# Patient Record
Sex: Male | Born: 1990 | Race: Black or African American | Marital: Single | Smoking: Never smoker
Health system: Southern US, Community
[De-identification: ages and names within clinical notes are randomized; demographics above are authoritative.]

## PROBLEM LIST (undated history)

## (undated) DIAGNOSIS — F329 Major depressive disorder, single episode, unspecified: Secondary | ICD-10-CM

## (undated) DIAGNOSIS — F32A Depression, unspecified: Secondary | ICD-10-CM

## (undated) DIAGNOSIS — J40 Bronchitis, not specified as acute or chronic: Secondary | ICD-10-CM

## (undated) DIAGNOSIS — F419 Anxiety disorder, unspecified: Secondary | ICD-10-CM

---

## 2012-09-03 ENCOUNTER — Emergency Department (HOSPITAL_COMMUNITY)
Admission: EM | Admit: 2012-09-03 | Discharge: 2012-09-03 | Disposition: A | Discharge status: Home / Self Care | Payer: BC Managed Care – PPO | Attending: Emergency Medicine | Admitting: Emergency Medicine

## 2012-09-03 ENCOUNTER — Encounter (HOSPITAL_COMMUNITY): Payer: Self-pay | Admitting: *Deleted

## 2012-09-03 DIAGNOSIS — R51 Headache: Secondary | ICD-10-CM | POA: Insufficient documentation

## 2012-09-03 DIAGNOSIS — R509 Fever, unspecified: Secondary | ICD-10-CM

## 2012-09-03 DIAGNOSIS — IMO0001 Reserved for inherently not codable concepts without codable children: Secondary | ICD-10-CM | POA: Insufficient documentation

## 2012-09-03 DIAGNOSIS — K5900 Constipation, unspecified: Secondary | ICD-10-CM | POA: Insufficient documentation

## 2012-09-03 DIAGNOSIS — Z8719 Personal history of other diseases of the digestive system: Secondary | ICD-10-CM

## 2012-09-03 DIAGNOSIS — R52 Pain, unspecified: Secondary | ICD-10-CM | POA: Insufficient documentation

## 2012-09-03 DIAGNOSIS — R110 Nausea: Secondary | ICD-10-CM

## 2012-09-03 DIAGNOSIS — R630 Anorexia: Secondary | ICD-10-CM | POA: Insufficient documentation

## 2012-09-03 DIAGNOSIS — R0609 Other forms of dyspnea: Secondary | ICD-10-CM | POA: Insufficient documentation

## 2012-09-03 DIAGNOSIS — B338 Other specified viral diseases: Secondary | ICD-10-CM | POA: Insufficient documentation

## 2012-09-03 DIAGNOSIS — R0989 Other specified symptoms and signs involving the circulatory and respiratory systems: Secondary | ICD-10-CM | POA: Insufficient documentation

## 2012-09-03 LAB — URINALYSIS, ROUTINE W REFLEX MICROSCOPIC
Glucose, UA: NEGATIVE mg/dL
Ketones, ur: NEGATIVE mg/dL
Leukocytes, UA: NEGATIVE
Nitrite: NEGATIVE
Specific Gravity, Urine: 1.011 (ref 1.005–1.030)
pH: 7.5 (ref 5.0–8.0)

## 2012-09-03 MED ORDER — ONDANSETRON 4 MG PO TBDP
4.0000 mg | ORAL_TABLET | Freq: Once | ORAL | Status: AC
  Administered 2012-09-03: 4 mg via ORAL
  Filled 2012-09-03: qty 1

## 2012-09-03 MED ORDER — ONDANSETRON 4 MG PO TBDP: 4.0000 mg | ORAL_TABLET | Freq: Once | ORAL | Status: DC

## 2012-09-03 NOTE — Discharge Instructions (Signed)
Nausea and Vomiting Nausea is a sick feeling that often comes before throwing up (vomiting). Vomiting is a reflex where stomach contents come out of your mouth. Vomiting can cause severe loss of body fluids (dehydration). Children and elderly adults can become dehydrated quickly, especially if they also have diarrhea. Nausea and vomiting are symptoms of a condition or disease. It is important to find the cause of your symptoms. CAUSES   Direct irritation of the stomach lining. This irritation can result from increased acid production (gastroesophageal reflux disease), infection, food poisoning, taking certain medicines (such as nonsteroidal anti-inflammatory drugs), alcohol use, or tobacco use.  Signals from the brain.These signals could be caused by a headache, heat exposure, an inner ear disturbance, increased pressure in the brain from injury, infection, a tumor, or a concussion, pain, emotional stimulus, or metabolic problems.  An obstruction in the gastrointestinal tract (bowel obstruction).  Illnesses such as diabetes, hepatitis, gallbladder problems, appendicitis, kidney problems, cancer, sepsis, atypical symptoms of a heart attack, or eating disorders.  Medical treatments such as chemotherapy and radiation.  Receiving medicine that makes you sleep (general anesthetic) during surgery. DIAGNOSIS Your caregiver may ask for tests to be done if the problems do not improve after a few days. Tests may also be done if symptoms are severe or if the reason for the nausea and vomiting is not clear. Tests may include:  Urine tests.  Blood tests.  Stool tests.  Cultures (to look for evidence of infection).  X-rays or other imaging studies. Test results can help your caregiver make decisions about treatment or the need for additional tests. TREATMENT You need to stay well hydrated. Drink frequently but in small amounts.You may wish to drink water, sports drinks, clear broth, or eat frozen  ice pops or gelatin dessert to help stay hydrated.When you eat, eating slowly may help prevent nausea.There are also some antinausea medicines that may help prevent nausea. HOME CARE INSTRUCTIONS   Take all medicine as directed by your caregiver.  If you do not have an appetite, do not force yourself to eat. However, you must continue to drink fluids.  If you have an appetite, eat a normal diet unless your caregiver tells you differently.  Eat a variety of complex carbohydrates (rice, wheat, potatoes, bread), lean meats, yogurt, fruits, and vegetables.  Avoid high-fat foods because they are more difficult to digest.  Drink enough water and fluids to keep your urine clear or pale yellow.  If you are dehydrated, ask your caregiver for specific rehydration instructions. Signs of dehydration may include:  Severe thirst.  Dry lips and mouth.  Dizziness.  Dark urine.  Decreasing urine frequency and amount.  Confusion.  Rapid breathing or pulse. SEEK IMMEDIATE MEDICAL CARE IF:   You have blood or brown flecks (like coffee grounds) in your vomit.  You have black or bloody stools.  You have a severe headache or stiff neck.  You are confused.  You have severe abdominal pain.  You have chest pain or trouble breathing.  You do not urinate at least once every 8 hours.  You develop cold or clammy skin.  You continue to vomit for longer than 24 to 48 hours.  You have a fever. MAKE SURE YOU:   Understand these instructions.  Will watch your condition.  Will get help right away if you are not doing well or get worse. Document Released: 09/19/2005 Document Revised: 12/12/2011 Document Reviewed: 02/16/2011 [facility] Patient Information 2013 [place], [place]. Fever, Adult A fever is  a temperature of 100.4 F (38 C) or above.  HOME CARE  Take fever medicine as told by your doctor. Do not  take aspirin for fever if you are younger than 22 years of age.  If you are given  antibiotic medicine, take it as told. Finish the medicine even if you start to feel better.  Rest.  Drink enough fluids to keep your pee (urine) clear or pale yellow. Do not drink alcohol.  Take a bath or shower with room temperature water. Do not use ice water or alcohol sponge baths.  Wear lightweight, loose clothes. GET HELP RIGHT AWAY IF:   You are short of breath or have trouble breathing.  You are very weak.  You are dizzy or you pass out (faint).  You are very thirsty or are making little or no urine.  You have new pain.  You throw up (vomit) or have watery poop (diarrhea).  You keep throwing up or having watery poop for more than 1 to 2 days.  You have a stiff neck or light bothers your eyes.  You have a skin rash.  You have a fever or problems (symptoms) that last for more than 2 to 3 days.  You have a fever and your problems quickly get worse.  You keep throwing up the fluids you drink.  You do not feel better after 3 days.  You have new problems. MAKE SURE YOU:   Understand these instructions.  Will watch your condition.  Will get help right away if you are not doing well or get worse. Document Released: 06/28/2008 Document Revised: 12/12/2011 Document Reviewed: 07/21/2011 [facility] Patient Information 2013 [place], [place].

## 2012-09-03 NOTE — ED Notes (Signed)
Pts symptoms started this morning and have been intermittent since then; Pt denies dizziness and lightheadedness; Pt states when headache is bad it is really and and almost decapitating; pt denies shortness of breath; pt states nausea and has not eaten; pt mentating appropriately; pt states weakness; pt denies chills currently.

## 2012-09-03 NOTE — ED Notes (Signed)
Dr. Bonk at bedside 

## 2012-09-03 NOTE — ED Provider Notes (Signed)
History   This chart was scribed for Dakota Skene, MD by Gerlean Ren, ED Scribe. This patient was seen in room TR08C/TR08C and the patient's care was started at 7:36 PM    CSN: [id number]  Arrival date & time 09/03/12  1755   First MD Initiated Contact with Patient 09/03/12 1904      Chief Complaint  Patient presents with  . Fever  . Generalized Body Aches     The history is provided by the patient. No language interpreter was used.   Dakota Mcgee is a 21 y.o. male with h/o chronic constipation who presents to the Emergency Department complaining of waxing-and-waning fevers, chills, myalgias and HA all with gradual onset this morning.  Symptoms not improved by ibuprofen taken this morning and temporarily improved by Aleve 3 hours ago but are currently gradually returning.  Pt reports associated nausea with one episode of dry heaves, decreased appetite, and dyspnea while walking to class today.  Pt denies irregular bowel movements, cough, sick contacts, frequency, abdominal pain, chest pain, and sore throat as associated symptoms.  Pt states he was given an unknown medication for his chronic constipation but did not take it regularly.  Pt denies tobacco and alcohol use.  Pt expresses a clear concern that he may have an STD due to having unprotected sex last week and reports occasional dysuria, however pt denies any penile discharge, penile bleeding, or any further symptoms.  History reviewed. No pertinent past medical history.  History reviewed. No pertinent past surgical history.  History reviewed. No pertinent family history.  History  Substance Use Topics  . Smoking status: Not on file  . Smokeless tobacco: Not on file  . Alcohol Use: Not on file      Review of Systems At least 10pt or greater review of systems completed and are negative except where specified in the HPI.  Allergies  Review of patient's allergies indicates no known allergies.  Home Medications  No  current outpatient prescriptions on file.  BP 116/89  Pulse 99  Temp 100.8 F (38.2 C) (Oral)  Resp 16  SpO2 98%  Physical Exam  Nursing notes reviewed.  Electronic medical record reviewed. VITAL SIGNS:   Filed Vitals:   09/03/12 1807 09/03/12 2048  BP: 116/89 113/55  Pulse: 99 89  Temp: 100.8 F (38.2 C) 99.3 F (37.4 C)  TempSrc: Oral Oral  Resp: 16 17  SpO2: 98% 100%   CONSTITUTIONAL: Awake, oriented, appears non-toxic HENT: Atraumatic, normocephalic, oral mucosa pink and moist, airway patent. Nares patent without drainage. External ears normal. EYES: Conjunctiva clear, EOMI, PERRLA NECK: Trachea midline, non-tender, supple CARDIOVASCULAR: Normal heart rate, Normal rhythm, No murmurs, rubs, gallops PULMONARY/CHEST: Clear to auscultation, no rhonchi, wheezes, or rales. Symmetrical breath sounds. Non-tender. ABDOMINAL: Non-distended, soft, non-tender - no rebound or guarding.  BS normal. GU: Normal circumcised male, no masses, no hernia, no lesions NEUROLOGIC: Non-focal, moving all four extremities, no gross sensory or motor deficits. EXTREMITIES: No clubbing, cyanosis, or edema SKIN: Warm, Dry, No erythema, No rash  ED Course  Procedures (including critical care time) DIAGNOSTIC STUDIES: Oxygen Saturation is 98% on room air, normal by my interpretation.    COORDINATION OF CARE: 7:48 PM- Patient informed of clinical course, understands medical decision-making process, and agrees with plan.  Labs Reviewed - No data to display Results for orders placed during the hospital encounter of 09/03/12  URINALYSIS, ROUTINE W REFLEX MICROSCOPIC      Component Value Range   Color, Urine  YELLOW  YELLOW   APPearance CLEAR  CLEAR   Specific Gravity, Urine 1.011  1.005 - 1.030   pH 7.5  5.0 - 8.0   Glucose, UA NEGATIVE  NEGATIVE mg/dL   Hgb urine dipstick NEGATIVE  NEGATIVE   Bilirubin Urine NEGATIVE  NEGATIVE   Ketones, ur NEGATIVE  NEGATIVE mg/dL   Protein, ur NEGATIVE   NEGATIVE mg/dL   Urobilinogen, UA 1.0  0.0 - 1.0 mg/dL   Nitrite NEGATIVE  NEGATIVE   Leukocytes, UA NEGATIVE  NEGATIVE    No results found.   1. Fever   2. Nausea   3. H/O constipation       MDM  Dakota Mcgee is a 21 y.o. male presenting with a history of nausea, history of constipation and fever. She is nontoxic, is normal vital signs, and a mild fever of 100.8. Patient has some mild GI distress with body aches chills decreased appetite, headache no real upper respiratory syndrome, but still this is a viral type illness. Patient says he had unprotected sex-no indication of any lesions or STI at this time. Discussed testing for sexually transmitted infections including HIV in the future with his primary care physician.  Patient had unprotected sex last week-I doubt that just last week he's contracted HIV and has an acute reaction. told to stay on a bowel regimen given Zofran for nausea  I explained the diagnosis and have given explicit precautions to return to the ER including any other new or worsening symptoms. The patient understands and accepts the medical plan as it's been dictated and I have answered their questions. Discharge instructions concerning home care and prescriptions have been given.  The patient is STABLE and is discharged to home in good condition.   I personally performed the services described in this documentation, which was scribed in my presence. The recorded information has been reviewed and is accurate. Dakota Mcgee, M.D.          Dakota Skene, MD 09/09/12 2138

## 2012-09-03 NOTE — ED Notes (Addendum)
Pt in c/o generalized body aches, chills, decreased appetite, also headache, symptoms since this am

## 2012-09-03 NOTE — ED Notes (Signed)
Pt ambulatory leaving ED with friend. Pt given d/c teaching and prescriptions. Pt has no further questions upon d/c. Pt does not appear to be in any acute distress.

## 2015-07-30 ENCOUNTER — Emergency Department (HOSPITAL_COMMUNITY)
Admission: EM | Admit: 2015-07-30 | Discharge: 2015-07-30 | Disposition: A | Discharge status: Home / Self Care | Payer: Self-pay | Attending: Emergency Medicine | Admitting: Emergency Medicine

## 2015-07-30 ENCOUNTER — Emergency Department (HOSPITAL_COMMUNITY): Payer: Self-pay

## 2015-07-30 ENCOUNTER — Encounter (HOSPITAL_COMMUNITY): Payer: Self-pay | Admitting: *Deleted

## 2015-07-30 DIAGNOSIS — R0789 Other chest pain: Secondary | ICD-10-CM | POA: Insufficient documentation

## 2015-07-30 DIAGNOSIS — R0602 Shortness of breath: Secondary | ICD-10-CM | POA: Insufficient documentation

## 2015-07-30 DIAGNOSIS — J209 Acute bronchitis, unspecified: Secondary | ICD-10-CM | POA: Insufficient documentation

## 2015-07-30 DIAGNOSIS — R0981 Nasal congestion: Secondary | ICD-10-CM | POA: Insufficient documentation

## 2015-07-30 HISTORY — DX: Bronchitis, not specified as acute or chronic: J40

## 2015-07-30 MED ORDER — PREDNISONE 20 MG PO TABS
60.0000 mg | ORAL_TABLET | Freq: Once | ORAL | Status: AC
  Administered 2015-07-30: 60 mg via ORAL
  Filled 2015-07-30: qty 3

## 2015-07-30 MED ORDER — ALBUTEROL SULFATE (2.5 MG/3ML) 0.083% IN NEBU
5.0000 mg | INHALATION_SOLUTION | Freq: Once | RESPIRATORY_TRACT | Status: AC
  Administered 2015-07-30: 5 mg via RESPIRATORY_TRACT

## 2015-07-30 MED ORDER — AEROCHAMBER PLUS W/MASK MISC
1.0000 | Freq: Once | Status: AC
  Administered 2015-07-30: 1
  Filled 2015-07-30: qty 1

## 2015-07-30 MED ORDER — ALBUTEROL SULFATE (2.5 MG/3ML) 0.083% IN NEBU
5.0000 mg | INHALATION_SOLUTION | Freq: Once | RESPIRATORY_TRACT | Status: AC
Start: 2015-07-30 — End: 2015-07-30
  Administered 2015-07-30: 5 mg via RESPIRATORY_TRACT
  Filled 2015-07-30: qty 6

## 2015-07-30 MED ORDER — ALBUTEROL SULFATE HFA 108 (90 BASE) MCG/ACT IN AERS
2.0000 | INHALATION_SPRAY | RESPIRATORY_TRACT | Status: DC | PRN
  Administered 2015-07-30: 2 via RESPIRATORY_TRACT
  Filled 2015-07-30: qty 6.7

## 2015-07-30 MED ORDER — PREDNISONE 20 MG PO TABS: ORAL_TABLET | ORAL | Status: DC

## 2015-07-30 MED ORDER — ALBUTEROL SULFATE (2.5 MG/3ML) 0.083% IN NEBU
INHALATION_SOLUTION | RESPIRATORY_TRACT | Status: AC
  Filled 2015-07-30: qty 6

## 2015-07-30 NOTE — ED Provider Notes (Signed)
CSN: [id number]     Arrival date & time 07/30/15  1219 History   First MD Initiated Contact with Patient 07/30/15 1438     Chief Complaint  Patient presents with  . Shortness of Breath  . Wheezing     (Consider location/radiation/quality/duration/timing/severity/associated sxs/prior Treatment) HPI Complains of nasal congestion cough productive of yellowish sputum wheezing and shortness of breath and chest pain anterior worse with coughing onset 1 week ago. No known fever. He is treated himself with Tylenol Sinus medicine without relief. 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 He feels that his breathing is at baseline since treated with albuterol nebulizer here prior to my exam Past Medical History  Diagnosis Date  . Bronchitis    History reviewed. No pertinent past surgical history. No family history on file. Social History  Substance Use Topics  . Smoking status: Never Smoker   . Smokeless tobacco: None  . Alcohol Use: No    no illicit drug use Review of Systems  Constitutional: Negative.   HENT: Positive for congestion.   Respiratory: Positive for cough, shortness of breath and wheezing.   Cardiovascular: Negative.   Gastrointestinal: Negative.   Musculoskeletal: Negative.   Skin: Negative.   Neurological: Negative.   Psychiatric/Behavioral: Negative.   All other systems reviewed and are negative.     Allergies  Lactose intolerance (gi)  Home Medications   Prior to Admission medications   Medication Sig Start Date End Date Taking? Authorizing Provider  ibuprofen (ADVIL,MOTRIN) 200 MG tablet Take 200 mg by mouth every 6 (six) hours as needed. For pain   Yes Historical Provider, MD  naproxen sodium (ANAPROX) 220 MG tablet Take 220 mg by mouth 2 (two) times daily as needed. For pain   Yes Historical Provider, MD  ondansetron (ZOFRAN-ODT) 4 MG disintegrating tablet Take 1 tablet (4 mg total) by mouth  once. 09/03/12  Yes John-Adam Bonk, MD  pseudoephedrine-acetaminophen (TYLENOL SINUS) 30-500 MG TABS tablet Take 1 tablet by mouth every 4 (four) hours as needed (congestion).   Yes Historical Provider, MD   BP 127/84 mmHg  Pulse 93  Temp(Src) 98.6 F (37 C) (Oral)  Resp 20  SpO2 97% Physical Exam  Constitutional: He appears well-developed and well-nourished. No distress.  HENT:  Head: Normocephalic and atraumatic.  Eyes: Conjunctivae are normal. Pupils are equal, round, and reactive to light.  Neck: Neck supple. No tracheal deviation present. No thyromegaly present.  Cardiovascular: Normal rate and regular rhythm.   No murmur heard. Pulmonary/Chest: Effort normal and breath sounds normal.  Abdominal: Soft. Bowel sounds are normal. He exhibits no distension. There is no tenderness.  Musculoskeletal: Normal range of motion. He exhibits no edema or tenderness.  Neurological: He is alert. Coordination normal.  Skin: Skin is warm and dry. No rash noted.  Psychiatric: He has a normal mood and affect.  Nursing note and vitals reviewed.   ED Course  Procedures (including critical care time) Labs Review Labs Reviewed - No data to display  Imaging Review Dg Chest 2 View  07/30/2015  CLINICAL DATA:  Shortness of breath. EXAM: CHEST  2 VIEW COMPARISON:  None. FINDINGS: Bronchitic markings without collapse or consolidation. No edema, effusion, or air leak. Normal heart size and mediastinal contours. IMPRESSION: Bronchitis without pneumonia. Electronically Signed   By: Marnee SpringJonathon  Watts M.D.   On: 07/30/2015 13:04   I have personally reviewed and evaluated these images and lab results as part of  my medical decision-making.   EKG Interpretation   Date/Time:  Thursday July 30 2015 12:29:41 EDT Ventricular Rate:  81 PR Interval:  158 QRS Duration: 80 QT Interval:  348 QTC Calculation: 404 R Axis:   73 Text Interpretation:  Normal sinus rhythm with sinus arrhythmia Normal ECG  No old  tracing to compare Confirmed by Lolly Glaus  MD, Tosca Pletz [phone]) on  07/30/2015 3:08:32 PM     Chest x-ray viewed by me  1540 p.m. patient and at around the emergency department and his pulse oximetry desaturated to 83%. He became mildly short of breath. A second nebulized treatment was ordered and he was administered prednisone orally. At 1700 hrs. patient is breathing at baseline. He is able to ambulate with pulse oximetry not going below 97% and feels ready to go home Results for orders placed or performed during the hospital encounter of 09/03/12  Urinalysis, Routine w reflex microscopic  Result Value Ref Range   Color, Urine YELLOW YELLOW   APPearance CLEAR CLEAR   Specific Gravity, Urine 1.011 1.005 - 1.030   pH 7.5 5.0 - 8.0   Glucose, UA NEGATIVE NEGATIVE mg/dL   Hgb urine dipstick NEGATIVE NEGATIVE   Bilirubin Urine NEGATIVE NEGATIVE   Ketones, ur NEGATIVE NEGATIVE mg/dL   Protein, ur NEGATIVE NEGATIVE mg/dL   Urobilinogen, UA 1.0 0.0 - 1.0 mg/dL   Nitrite NEGATIVE NEGATIVE   Leukocytes, UA NEGATIVE NEGATIVE   Dg Chest 2 View  07/30/2015  CLINICAL DATA:  Shortness of breath. EXAM: CHEST  2 VIEW COMPARISON:  None. FINDINGS: Bronchitic markings without collapse or consolidation. No edema, effusion, or air leak. Normal heart size and mediastinal contours. IMPRESSION: Bronchitis without pneumonia. Electronically Signed   By: Marnee Spring M.D.   On: 07/30/2015 13:04    MDM  Prescription prednisone, albuterol HFA to go with spacer to use 2 puffs every 4 hours when necessary cough or shortness of breath. He is instructed to return if there are more than 4 hours. He'll be referred to [facility] and also be given the resource guide to get a primary care physician Diagnosis acute bronchitis Final diagnoses:  None        Doug Sou, MD 07/30/15 1721

## 2015-07-30 NOTE — Discharge Instructions (Signed)
Acute Bronchitis Usually inhaler 2 puffs every 4 hours as needed for cough or shortness of breath. Return if needed more than every 4 hours. Call any of the numbers  from the resource guide or the [facility] to get a primary care physician Bronchitis is when the airways that extend from the windpipe into the lungs get red, puffy, and painful (inflamed). Bronchitis often causes thick spit (mucus) to develop. This leads to a cough. A cough is the most common symptom of bronchitis. In acute bronchitis, the condition usually begins suddenly and goes away over time (usually in 2 weeks). Smoking, allergies, and asthma can make bronchitis worse. Repeated episodes of bronchitis may cause more lung problems. HOME CARE  Rest.  Drink enough fluids to keep your pee (urine) clear or pale yellow (unless you need to limit fluids as told by your doctor).  Only take over-the-counter or prescription medicines as told by your doctor.  Avoid smoking and secondhand smoke. These can make bronchitis worse. If you are a smoker, think about using nicotine gum or skin patches. Quitting smoking will help your lungs heal faster.  Reduce the chance of getting bronchitis again by:  Washing your hands often.  Avoiding people with cold symptoms.  Trying not to touch your hands to your mouth, nose, or eyes.  Follow up with your doctor as told. GET HELP IF: Your symptoms do not improve after 1 week of treatment. Symptoms include:  Cough.  Fever.  Coughing up thick spit.  Body aches.  Chest congestion.  Chills.  Shortness of breath.  Sore throat. GET HELP RIGHT AWAY IF:   You have an increased fever.  You have chills.  You have severe shortness of breath.  You have bloody thick spit (sputum).  You throw up (vomit) often.  You lose too much body fluid (dehydration).  You have a severe headache.  You faint. MAKE SURE YOU:   Understand these instructions.  Will  watch your condition.  Will get help right away if you are not doing well or get worse.   This information is not intended to replace advice given to you by your health care provider. Make sure you discuss any questions you have with your health care provider.   Document Released: 03/07/2008 Document Revised: 05/22/2013 Document Reviewed: 03/12/2013 Elsevier Interactive Patient Education 2016 [employer].  Emergency Department Resource Guide 1) Find a Doctor and Pay Out of Pocket Although you won't have to find out who is covered by your insurance plan, it is a good idea to ask around and get recommendations. You will then need to call the office and see if the doctor you have chosen will accept you as a new patient and what types of options they offer for patients who are self-pay. Some doctors offer discounts or will set up payment plans for their patients who do not have insurance, but you will need to ask so you aren't surprised when you get to your appointment.  2) Contact Your Local Health Department Not all health departments have doctors that can see patients for sick visits, but many do, so it is worth a call to see if yours does. If you don't know where your local health department is, you can check in your phone book. The CDC also has a tool to help you locate your state's health department, and many state websites also have listings of all of their local health departments.  3) Find a [employer]  If your illness is not likely to be very severe or complicated, you may want to try a walk in clinic. These are popping up all over the country in pharmacies, drugstores, and shopping centers. They're usually staffed by nurse practitioners or physician assistants that have been trained to treat common illnesses and complaints. They're usually fairly quick and inexpensive. However, if you have serious medical issues or chronic medical problems, these are probably not your best option.  No  Primary Care Doctor: - Call Health Connect at  [phone] - they can help you locate a primary care doctor that  accepts your insurance, provides certain services, etc. - Physician Referral Service- [phone]  Chronic Pain Problems: Organization         Address  Phone   Notes  [facility]  [phone] Patients need to be referred by their primary care doctor.   Medication Assistance: Organization         Address  Phone   Notes  [facility] [address] [place], [place] [address] [phone] --Must be a resident of [facility] [place] -- Must have NO insurance coverage whatsoever (no Medicaid/ Medicare, etc.) -- The pt. MUST have a primary care doctor that directs their care regularly and follows them in the community   MedAssist  [phone]   [employer]  [phone]    Agencies that provide inexpensive medical care: Organization         Address  Phone   Notes  [facility]  [phone]   [facility]    [phone]   [facility] [address] [place], [place] [address] [phone]   [facility] [address], [place] [phone]   [facility]anned [facility]    [phone](336) [phone]   [facility]    [phone]   [facility]  [address], [place] Phone:  [phone]) [phone], Fax:  [phone]) [phone] Hours of Operation:  9 am - 6 pm, M-F.  Also accepts Medicaid/Medicare and self-pay.  [facility]  [address], [place] Phone: [phone](336) [phone], Fax: [phone]) [phone]. Hours of Operation:  8:30 am - 5:30 pm, M-F.  Also accepts Medicaid and self-pay.  [facility] [place] [address], [place] Phone: [phone](336) [phone]   [facility] [address], [place], [place] [phone], Ext. [phone] Mondays & Thursdays: 7-9 AM.  First 15 patients are seen on  a first come, first serve basis.    Medicaid-accepting [facility] Providers:  Organization         Address  Phone   Notes  [facility] [address], [place] [phone] Also accepts self-pay patients.  [facility] [address], [place]  [phone](336) [phone]   [facility] [address], [place] [phone]   [facility] [address] [place] [address], [place] [phone]   Renaye RakersVeita Bland [address], [place]   [phone] Only accepts [place] Access [place] patients after they have their name applied to their card.   Self-Pay (no insurance) in [facility] [place]:  Organization         Address  Phone   Notes  Sickle Cell Patients, [facility] [address], [place] [phone]   °[facility] [address], [place] [phone]   °[facility] [place] ° [address], [place] [phone]   °[facility]/Dr. Osei-Bonsu ° [address] [place] [address], [place] or [address], [place] [phone] Phone number for both [place] and [place] locations is the same.  °[facility] [address], [place] [phone]   °[facility] [address] [place] [address], [place] or [address] [phone] °([phone]   °[facility] [address], [place] [phone], phone; [phone], fax Sees patients 1st and 3rd Saturday of every month.  Must not qualify for public or private insurance (i.e. Medicaid, Medicare, [place] Health Choice, Veterans' Benefits) • Household income should be no more than 200% of the poverty level •The clinic cannot treat you if you are pregnant or think you are pregnant • Sexually transmitted diseases are not treated at the clinic.  ° ° °Dental Care: °Organization          Address  Phone  Notes  °[facility] [address], [place] [phone] Accepts children up to age 21 who are enrolled in Medicaid or [place] Health Choice; pregnant women with a Medicaid card; and children who have applied for Medicaid or [place] Health Choice, but were declined, whose parents can pay a reduced fee at time of service.  °[facility] [place]  [address], [place] [phone] Accepts children up to age 21 who are enrolled in Medicaid or [place] Health Choice; pregnant women with a Medicaid card; and children who have applied for Medicaid or [place] Health Choice, but were declined, whose parents can pay a reduced fee at time of service.  °[facility] ° [address], [place] [phone] Patients are seen by appointment only. Walk-ins are not accepted. [facility] will see patients 18 years of age and older. °Monday - Tuesday (8am-5pm) °Most Wednesdays (8:30-5pm) °$30 per visit, cash only  °[facility] ° [address], [place] [phone]) [phone] Patients are seen by appointment only. Walk-ins are not accepted. [facility] will see patients 18 years of age and older. °One Wednesday Evening (Monthly: Volunteer Based).  $30 per visit, cash only  °[facility]  [phone] for adults; Children under age 4, call Graduate Pediatric Dentistry at [phone]. Children aged 4-14, please call [phone] to request a pediatric application. ° Dental services are provided in all areas of dental care including fillings, crowns and bridges, complete and partial dentures, implants, gum treatment, root canals, and extractions. Preventive care is also provided. Treatment is provided to both adults and children. °Patients are selected via a lottery and there is often a waiting list. °  °[facility] [address], °[place] ° ([phone]) [phone] [email or web address] °  °[facility] [address], [place], [place] [phone], Ext. [phone] Second and Fourth Thursday of each month, opens at 6:30 AM; Clinic ends at 9 AM.  Patients are seen on a first-come first-served basis, and a limited number are seen during each clinic.  ° °[facility] ° [address], [place], [place] [phone]   Eligibility Requirements °You must have lived in [place], [place], or [place] counties for   at least the last three months. °  You cannot be eligible for state or federal sponsored healthcare insurance, including Veterans Administration, Medicaid, or Medicare. °  You generally cannot be eligible for healthcare insurance through your employer.  °  How to apply: °Eligibility screenings are held every Tuesday and Wednesday afternoon from 1:00 pm until 4:00 pm. You do not need an appointment for the interview!  °[facility] [address], [place], [place] [phone]   °[facility]  [phone]   °[facility]  336-703-3100   °[facility]  336-570-6415   ° °Behavioral Health Resources in the Community: °Intensive Outpatient Programs °Organization         Address  Phone  Notes  °[facility] [address], [place], [place] [phone]   °[facility] [address], [place], [place] [phone]   °[facility] [address], [place], [place] ° 336-882-2125   °[facility] [address],  °[place], [place] 1-800-853-5163 or 336-641-4981   °Substance Abuse Resources °Organization         Address  Phone  Notes  °[facility]  336-882-2125   °[facility]  336-784-9470   °[facility]  336-285-9073   °[facility]  336-845-3988   °Residential & Outpatient Substance Abuse Program  1-800-659-3381   °Psychological  Services °Organization         Address  Phone  Notes  ° Health  336- 832-9600   °[facility]  336- 378-7881   °[facility] [address], [place] 1-800-853-5163 or 336-641-4981   ° °Mobile Crisis Teams °Organization         Address  Phone  Notes  °[facility], Mobile Crisis Care Unit  1-877-626-1772   °Assertive °[facility] ° [address] [place], [place] 336-834-9664   °Sharon DeEsch [address] °[place] 336-554-5454   ° °Self-Help/Support Groups °Organization         Address  Phone             Notes  °[facility] - variety of support groups  336- 373-1402 Call for more information  °Narcotics Anonymous (NA), [facility] [address], °[place]  2 meetings at this location  ° °Residential Treatment Programs °Organization         Address  Phone  Notes  °[facility] [address],    °[place]  1-866-801-8205   °[facility] ° [address], [place], [place] [phone]   °[facility] [address], [place] 336-845-3988 Admissions: 8am-3pm M-F  °[facility] [address]    °[place], [place] 336-841-1104   °[facility] [address], [place], [place] 336-379-7146   °[facility] [address]  °[place], [place] 336-285-9073   °[facility] - Intensive Outpatient [address], [place], [place] 336-852-3033   °[facility] [address]  °[place], [place] 1-877-615-2722 or 336-784-9470   °[facility] ([facility]) [address] [place],  336-227-7417 Accepts Medicaid  °[facility] [address]  °[place]  1-800-659-3381 Substance Abuse/Addiction Treatment  ° °[place] [facility] Health Resources °Organization         Address  Phone  Notes  °[facility]  (888)    [phone]   Angie FavaJulie Brannon, PhD [address] [place], [place]   [phone]) [phone] or [phone](336) [phone]   [facility]   [address] [place], [place] [phone](336) [phone]   [facility] [address], [place], [place] [phone](336) [phone] Insurance/Medicaid/sponsorship through [facility]  [facility] [address]                                    [place], [place] [phone]) [phone] Therapy/tele-psych/case  [facility] [address]   [place], [place] [phone](336) [phone]    Dr. Lolly MustacheArfeen  [phone](336) [phone]   [facility]  [employer] [facility] 1) [address],  2) [address], [place] 3)  [address], [place] [phone](336) [phone]  [phone](336) [phone]   [facility] Child Abuse Hot[facility] 947-836-3812(336) [phone] or [phone](336) [phone] (After Hours)

## 2015-07-30 NOTE — ED Notes (Signed)
Pt is in stable condition upon d/c and ambulates from ED. 

## 2015-07-30 NOTE — ED Notes (Signed)
Pt O2 saturation dropped to 77% on RA while ambulating. Dr. Shela CommonsJ informed.

## 2015-07-30 NOTE — ED Notes (Signed)
Pt is here with congestion, chest pains, and sob.  Pt reports wheezing with breathing

## 2015-08-21 ENCOUNTER — Emergency Department (HOSPITAL_COMMUNITY)
Admission: EM | Admit: 2015-08-21 | Discharge: 2015-08-21 | Disposition: A | Discharge status: Home / Self Care | Payer: Self-pay | Attending: Emergency Medicine | Admitting: Emergency Medicine

## 2015-08-21 ENCOUNTER — Encounter (HOSPITAL_COMMUNITY): Payer: Self-pay | Admitting: Emergency Medicine

## 2015-08-21 ENCOUNTER — Emergency Department (HOSPITAL_COMMUNITY): Payer: Self-pay

## 2015-08-21 DIAGNOSIS — R0602 Shortness of breath: Secondary | ICD-10-CM | POA: Insufficient documentation

## 2015-08-21 DIAGNOSIS — Z8709 Personal history of other diseases of the respiratory system: Secondary | ICD-10-CM | POA: Insufficient documentation

## 2015-08-21 LAB — BASIC METABOLIC PANEL
Anion gap: 12 (ref 5–15)
BUN: 13 mg/dL (ref 6–20)
CHLORIDE: 104 mmol/L (ref 101–111)
CO2: 24 mmol/L (ref 22–32)
CREATININE: 1.44 mg/dL — AB (ref 0.61–1.24)
Calcium: 9.8 mg/dL (ref 8.9–10.3)
GFR calc Af Amer: >60 mL/min (ref >60)
GFR calc non Af Amer: >60 mL/min (ref >60)
GLUCOSE: 86 mg/dL (ref 65–99)
POTASSIUM: 4.6 mmol/L (ref 3.5–5.1)
Sodium: 140 mmol/L (ref 135–145)

## 2015-08-21 LAB — CBC WITH DIFFERENTIAL/PLATELET
BASOS ABS: 0 10*3/uL (ref 0.0–0.1)
Basophils Relative: 1 %
EOS PCT: 7 %
Eosinophils Absolute: 0.5 10*3/uL (ref 0.0–0.7)
HCT: 47.9 % (ref 39.0–52.0)
Hemoglobin: 16.8 g/dL (ref 13.0–17.0)
LYMPHS PCT: 37 %
Lymphs Abs: 2.8 10*3/uL (ref 0.7–4.0)
MCH: 32.6 pg (ref 26.0–34.0)
MCHC: 35.1 g/dL (ref 30.0–36.0)
MCV: 92.8 fL (ref 78.0–100.0)
Monocytes Absolute: 0.7 10*3/uL (ref 0.1–1.0)
Monocytes Relative: 10 %
NEUTROS ABS: 3.4 10*3/uL (ref 1.7–7.7)
Neutrophils Relative %: 45 %
PLATELETS: 231 10*3/uL (ref 150–400)
RBC: 5.16 MIL/uL (ref 4.22–5.81)
RDW: 11.9 % (ref 11.5–15.5)
WBC: 7.5 10*3/uL (ref 4.0–10.5)

## 2015-08-21 MED ORDER — PREDNISONE 20 MG PO TABS
60.0000 mg | ORAL_TABLET | Freq: Once | ORAL | Status: AC
  Administered 2015-08-21: 60 mg via ORAL
  Filled 2015-08-21: qty 3

## 2015-08-21 MED ORDER — ALBUTEROL SULFATE HFA 108 (90 BASE) MCG/ACT IN AERS: 1.0000 | INHALATION_SPRAY | Freq: Four times a day (QID) | RESPIRATORY_TRACT | Status: DC | PRN

## 2015-08-21 MED ORDER — IPRATROPIUM-ALBUTEROL 0.5-2.5 (3) MG/3ML IN SOLN
3.0000 mL | Freq: Once | RESPIRATORY_TRACT | Status: AC
  Administered 2015-08-21: 3 mL via RESPIRATORY_TRACT
  Filled 2015-08-21: qty 3

## 2015-08-21 MED ORDER — PREDNISONE 10 MG (21) PO TBPK: 10.0000 mg | ORAL_TABLET | Freq: Every day | ORAL | Status: DC
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
## 2015-08-21 NOTE — Discharge Instructions (Signed)
As we discussed, please take the prednisone (steroid) as prescribed. You may use the albuterol inhaler as needed for shortness of breath. Please call one of the clinics below to establish primary care. I also gave you the phone number to a pulmonology office for further evaluation as well.  Take medications as prescribed. Return to the emergency room for worsening condition or new concerning symptoms. Follow up with your regular doctor. If you don't have a regular doctor use one of the numbers below to establish a primary care doctor.   Emergency Department Resource Guide 1) Find a Doctor and Pay Out of Pocket Although you won't have to find out who is covered by your insurance plan, it is a good idea to ask around and get recommendations. You will then need to call the office and see if the doctor you have chosen will accept you as a new patient and what types of options they offer for patients who are self-pay. Some doctors offer discounts or will set up payment plans for their patients who do not have insurance, but you will need to ask so you aren't surprised when you get to your appointment.  2) Contact Your Local Health Department Not all health departments have doctors that can see patients for sick visits, but many do, so it is worth a call to see if yours does. If you don't know where your local health department is, you can check in your phone book. The CDC also has a tool to help you locate your state's health department, and many state websites also have listings of all of their local health departments.  3) Find a Walk-in Clinic If your illness is not likely to be very severe or complicated, you may want to try a walk in clinic. These are popping up all over the country in pharmacies, drugstores, and shopping centers. They're usually staffed by nurse practitioners or physician assistants that have been trained to treat common illnesses and complaints. They're usually fairly quick and  inexpensive. However, if you have serious medical issues or chronic medical problems, these are probably not your best option.  No Primary Care Doctor: - Call Health Connect at  [phone] - they can help you locate a primary care doctor that  accepts your insurance, provides certain services, etc. - Physician Referral [phone]- [phone]  Emergency Department Resource Guide 1) Find a Doctor and Pay Out of Pocket Although you won't have to find out who is covered by your insurance plan, it is a good idea to ask around and get recommendations. You will then need to call the office and see if the doctor you have chosen will accept you as a new patient and what types of options they offer for patients who are self-pay. Some doctors offer discounts or will set up payment plans for their patients who do not have insurance, but you will need to ask so you aren't surprised when you get to your appointment.  2) Contact Your Local Health Department Not all health departments have doctors that can see patients for sick visits, but many do, so it is worth a call to see if yours does. If you don't know where your local health department is, you can check in your phone book. The CDC also has a tool to help you locate your state's health department, and many state websites also have listings of all of their local health departments.  3) Find a Walk-in Clinic If your illness is not likely to  be very severe or complicated, you may want to try a walk in clinic. These are popping up all over the country in pharmacies, drugstores, and shopping centers. They're usually staffed by nurse practitioners or physician assistants that have been trained to treat common illnesses and complaints. They're usually fairly quick and inexpensive. However, if you have serious medical issues or chronic medical problems, these are probably not your best option.  No Primary Care Doctor: - Call Health Connect at  [phone] - they can  help you locate a primary care doctor that  accepts your insurance, provides certain services, etc. - Physician Referral Service- [phone]  Chronic Pain Problems: Organization         Address  Phone   Notes  [facility]  [phone] Patients need to be referred by their primary care doctor.   Medication Assistance: Organization         Address  Phone   Notes  [facility] [address] [place], [place] [address] [phone] --Must be a resident of [place] -- Must have NO insurance coverage whatsoever (no Medicaid/ Medicare, etc.) -- The pt. MUST have a primary care doctor that directs their care regularly and follows them in the community   [facility]ist  [phone]   [employer]  [phone]    Agencies that provide inexpensive medical care: Organization         Address  Phone   Notes  [facility]  [phone]   [facility]    [phone]   [facility] [address] [place], [place] [address] [phone]   [facility] [address], [place] [phone]   [facility]    [phone]   [facility]    [phone]   [facility]  [address], [place] Phone:  [phone], Fax:  [phone] Hours of Operation:  9 am - 6 pm, M-F.  Also accepts Medicaid/Medicare and self-pay.  [facility]  [address], [place] Phone: [phone], Fax: [phone]. Hours of Operation:  8:30 am - 5:30 pm, M-F.  Also accepts Medicaid and self-pay.  [facility] [place] [address], [place] Phone: [phone]   [facility] [address], [place], [place] [phone], Ext. [phone] Mondays & Thursdays: 7-9 AM.  First 15 patients are seen on a first come, first serve basis.    Medicaid-accepting [place] Providers:  Organization         Address  Phone   Notes  [facility] [address],  [phone] Also accepts self-pay patients.  [facility] [address], [place]  [phone]   [facility] [address], [place] [phone]   [facility] [address] [place] [address], [place] [phone]   Renaye RakersVeita Bland [address], [place]   [phone] Only accepts [place] patients after they have their name applied to their card.   Self-Pay (no insurance) in [facility]:  Organization         Address  Phone   Notes  Sickle Cell Patients, [facility] [address], [place] [phone]   [facility] [place] [phone]   [facility] [address], [place] [phone]   [facility]/Dr. Osei-Bonsu  [address], [address], [place] [phone] Phone number for both [place] and [place] locations is the same.  [facility] [address] [place] [phone]   [facility] [address], [place] or [address] [phone]   [facility] [address] [place] [phone], phone; [phone], fax Sees patients 1st and 3rd Saturday of every month.  Must not qualify for public or private insurance (i.e. Medicaid, Medicare, [place] Health Choice, Veterans' Benefits)  Household income should be no more than 200% of the poverty level The clinic cannot treat you if you are pregnant or think you are pregnant  Sexually transmitted diseases are not treated at the clinic.

## 2015-08-21 NOTE — ED Notes (Signed)
Pt stayed about 93% during ambulation

## 2016-11-28 IMAGING — CR DG CHEST 2V
2 series · 2 of 2 positions shown · non-contrast
Comparison: None.

CLINICAL DATA: Shortness of breath.

EXAM:
CHEST  2 VIEW

[chest pa]
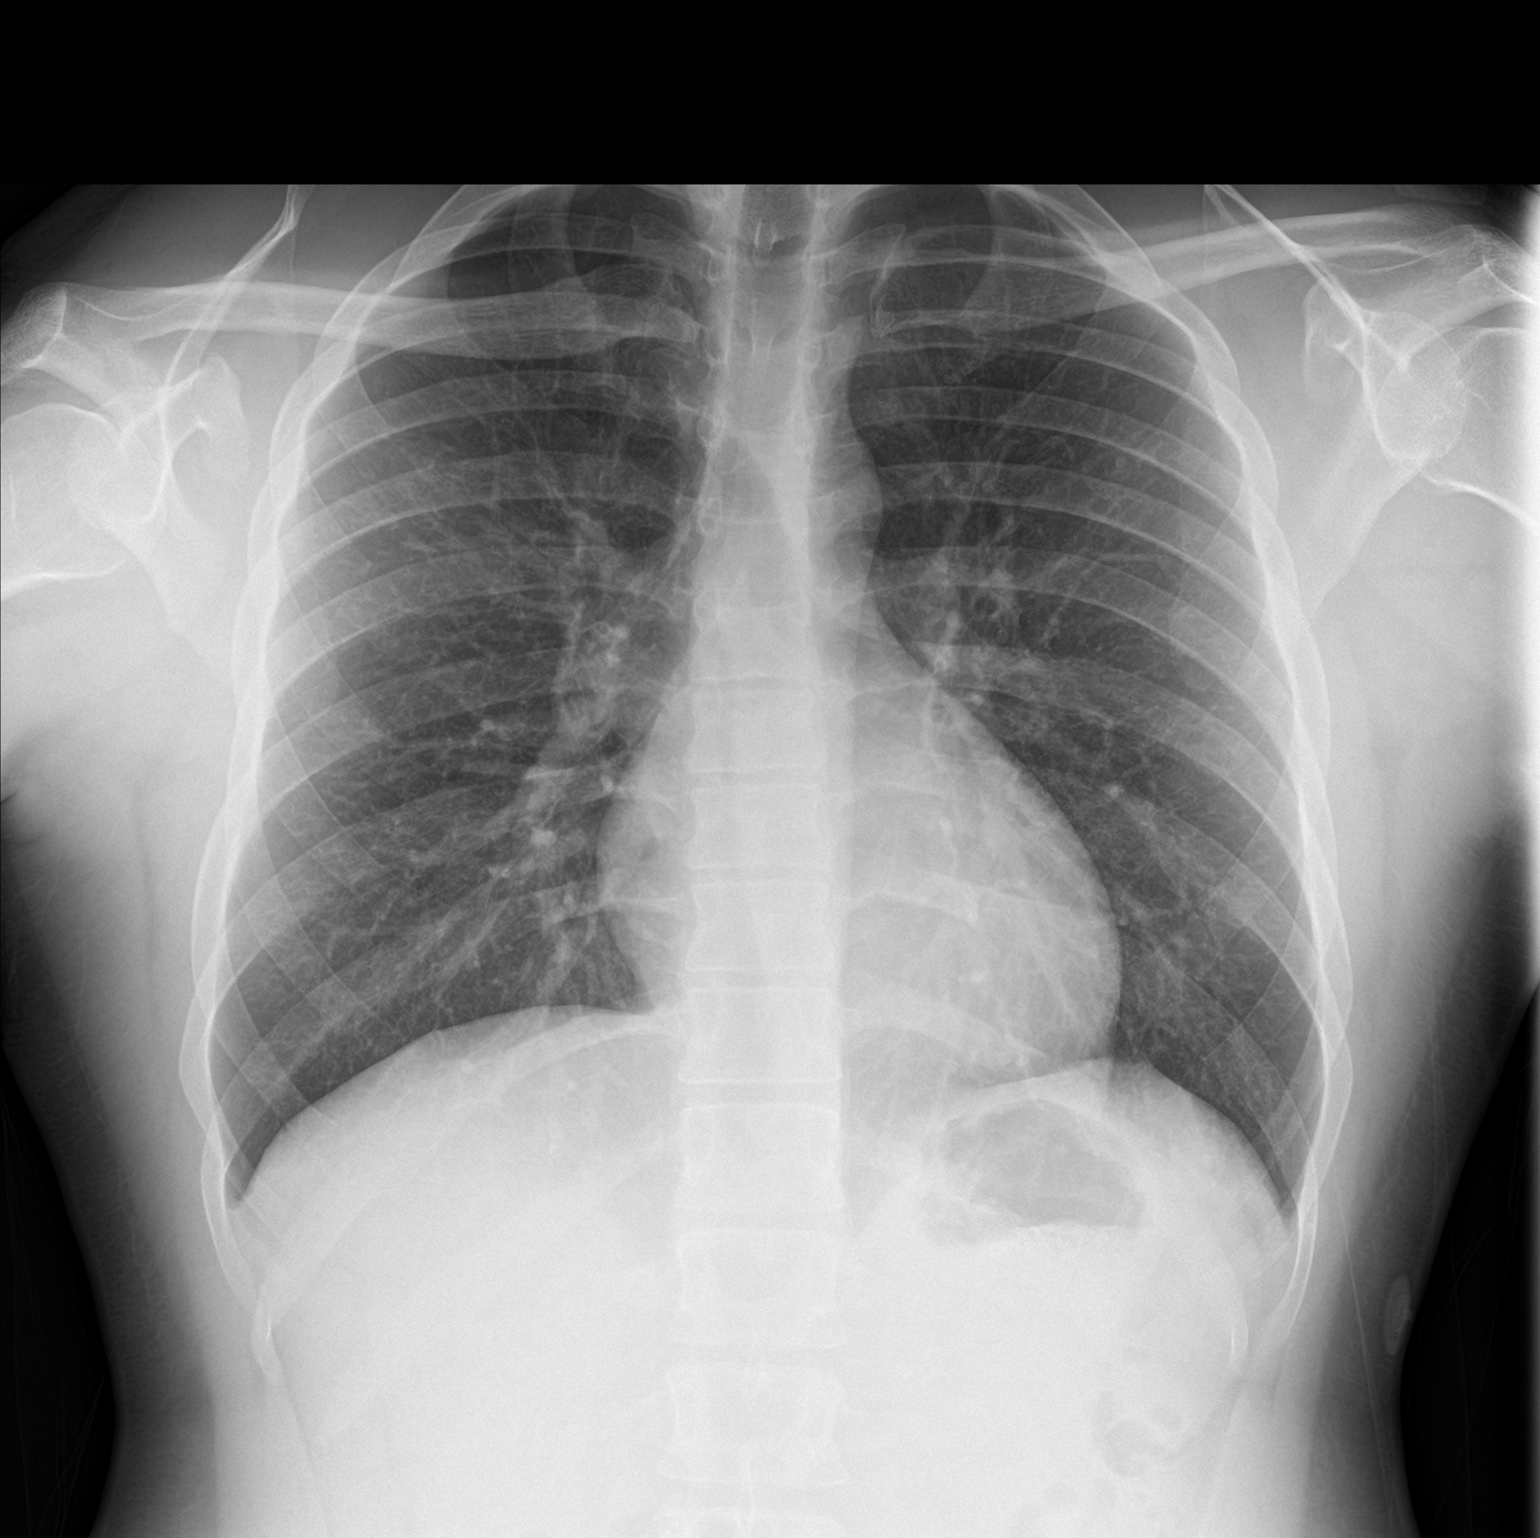

[chest lat]
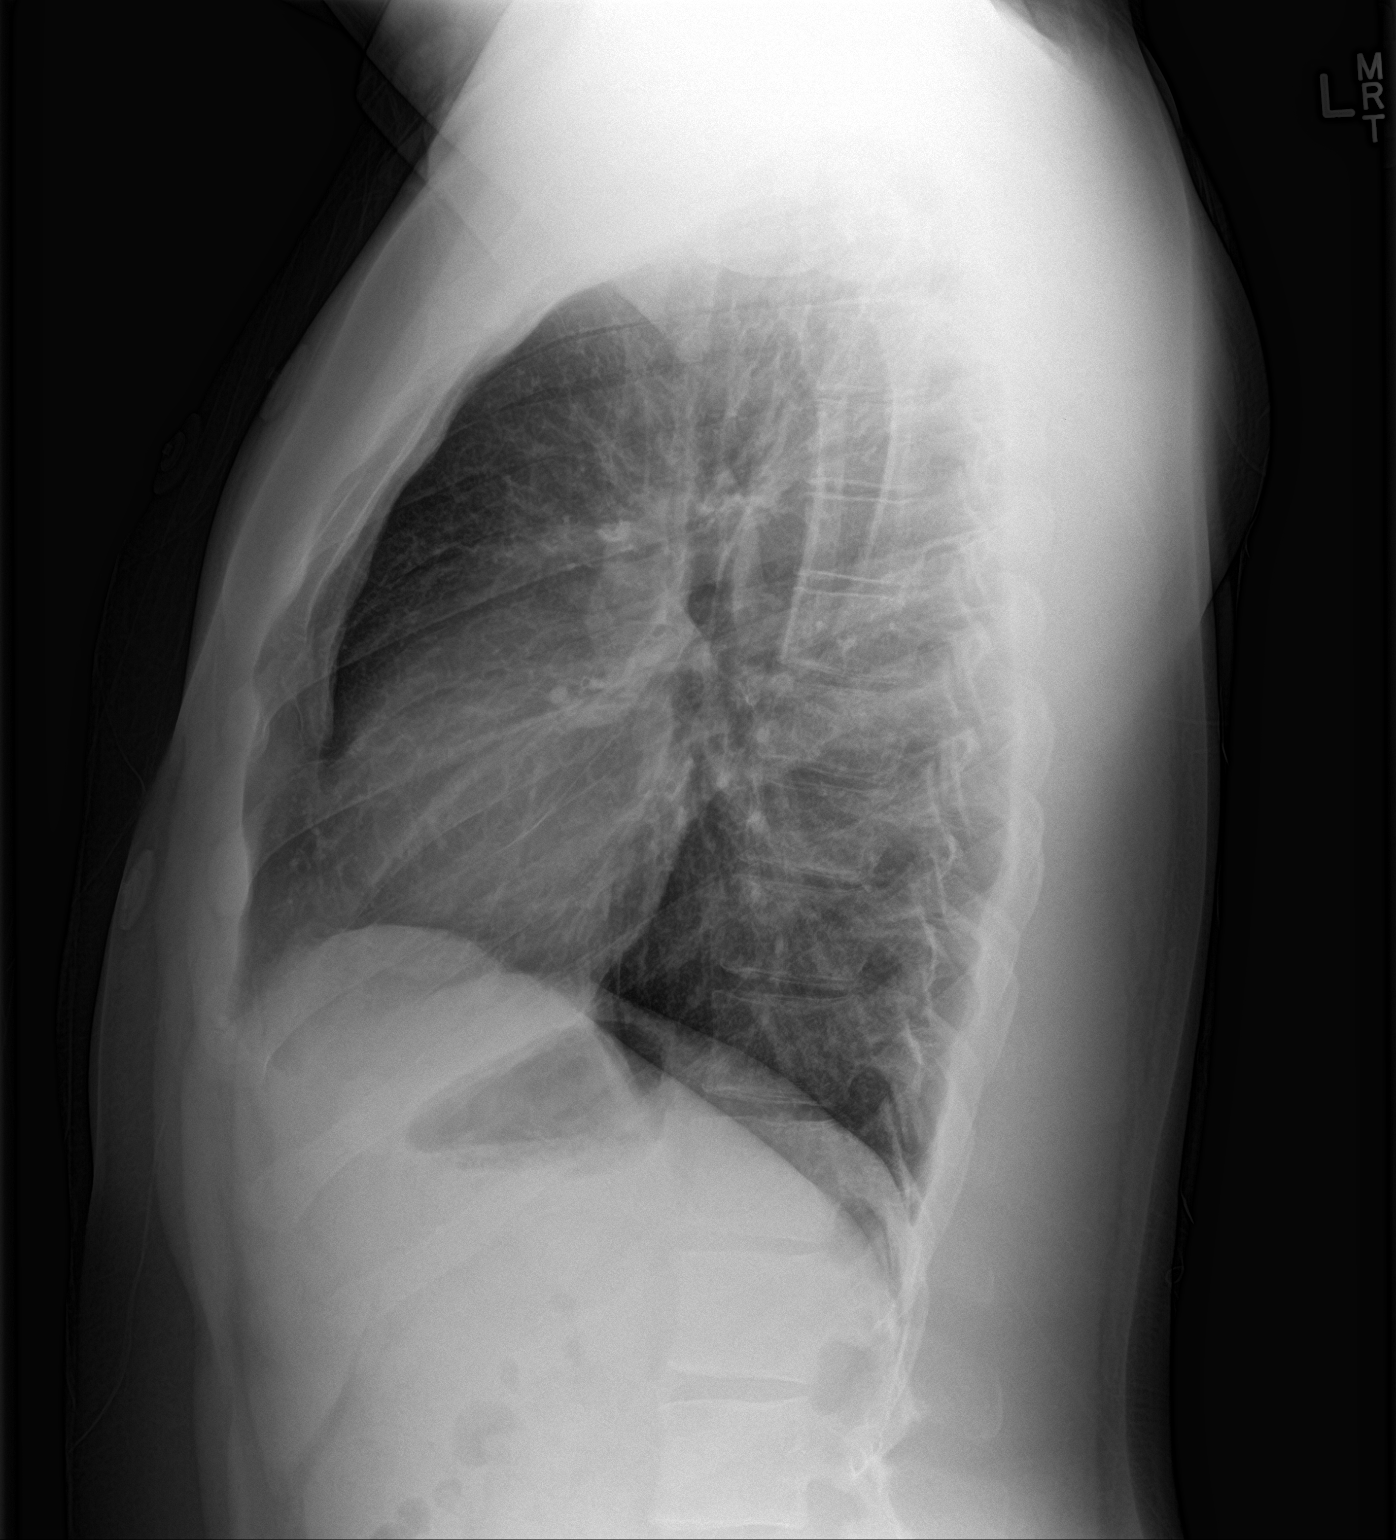

[2 of 2 positions shown; findings below may reference images not displayed]

FINDINGS: Bronchitic markings without collapse or consolidation. No edema,
effusion, or air leak. Normal heart size and mediastinal contours.
IMPRESSION: Bronchitis without pneumonia.

## 2016-12-20 IMAGING — CR DG CHEST 2V
2 series · 2 of 2 positions shown · non-contrast
Comparison: Chest radiograph 07/30/2015.

CLINICAL DATA: Cough, congestion shortness of breath. Sternal chest
pain for 1 month.

EXAM:
CHEST  2 VIEW

[w chest pa]
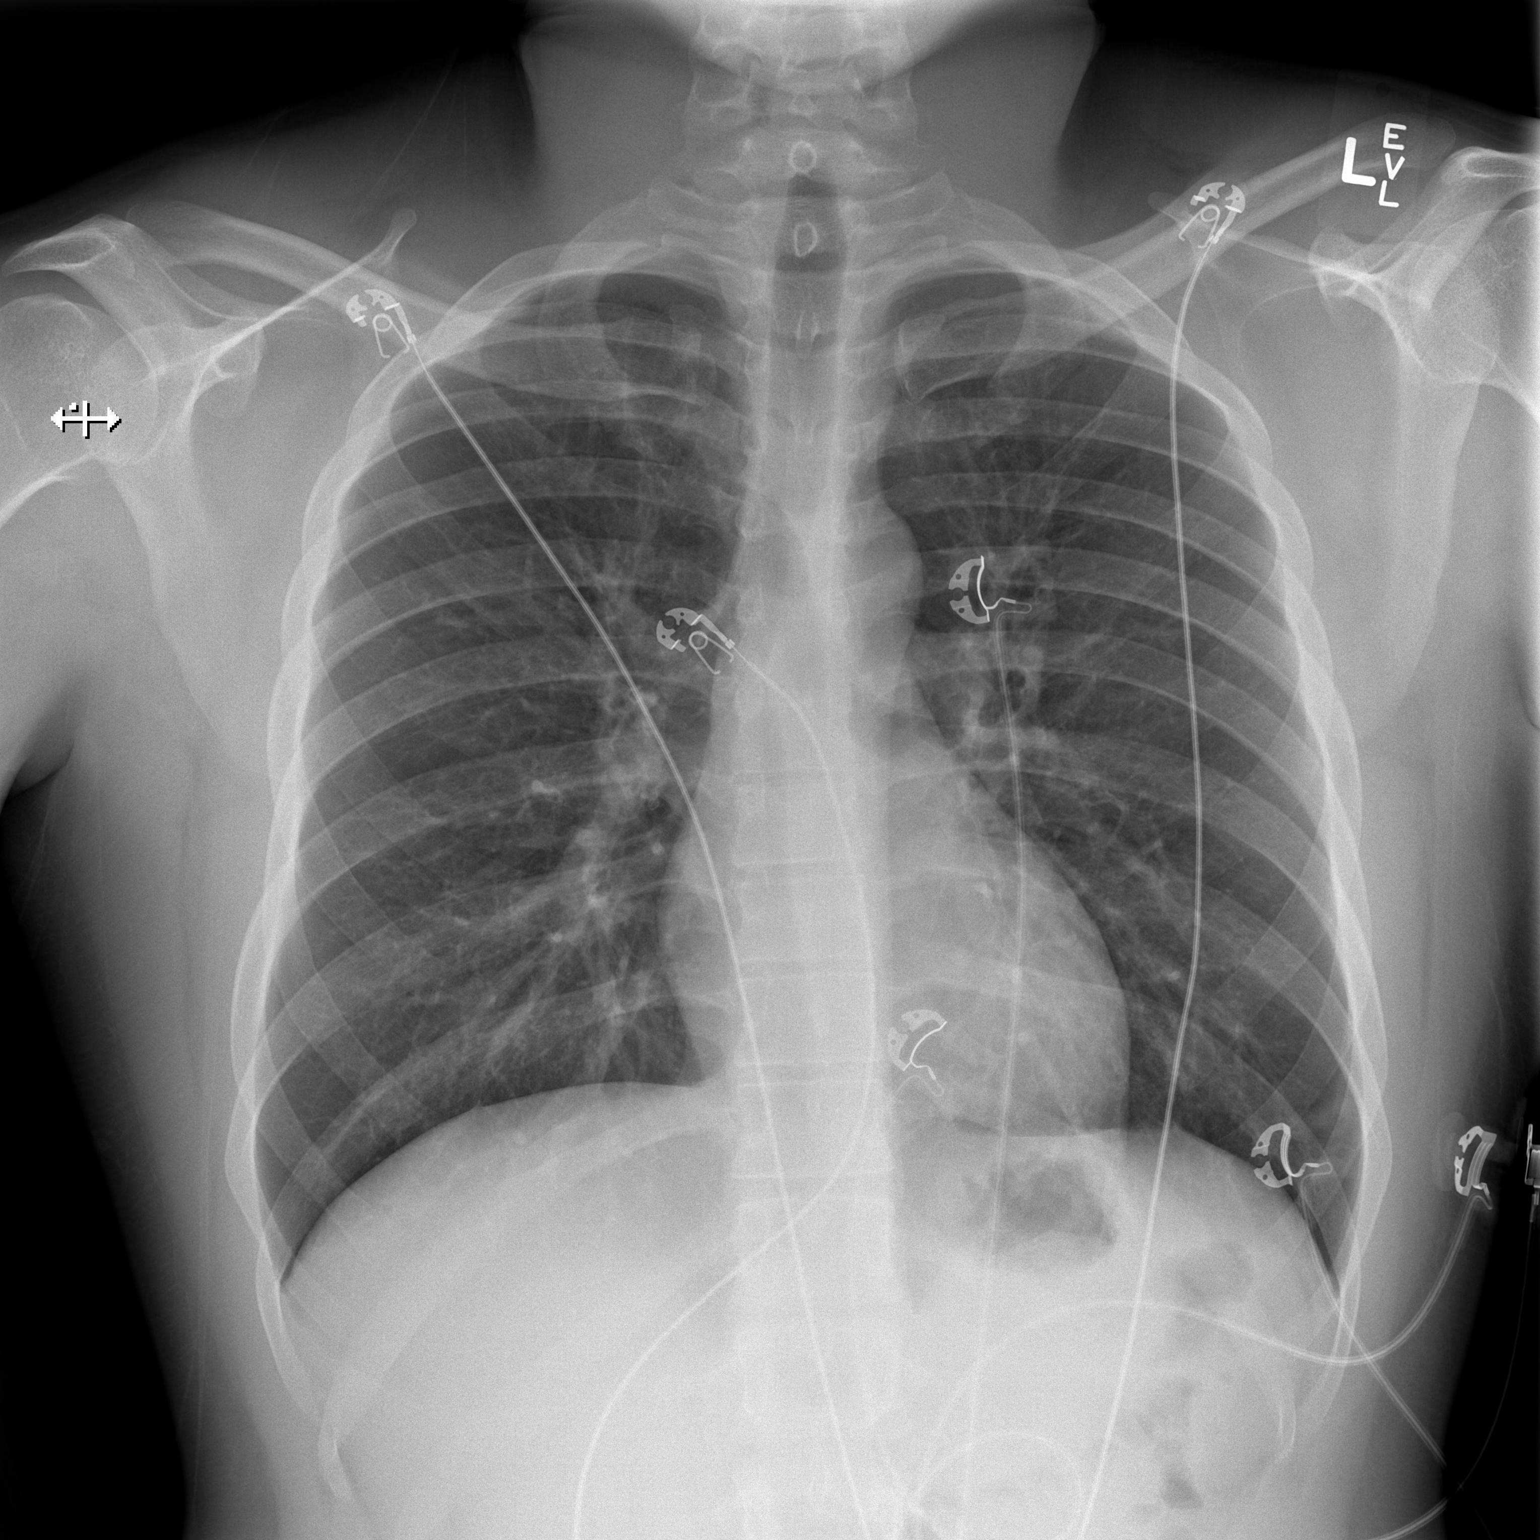

[w chest lat]
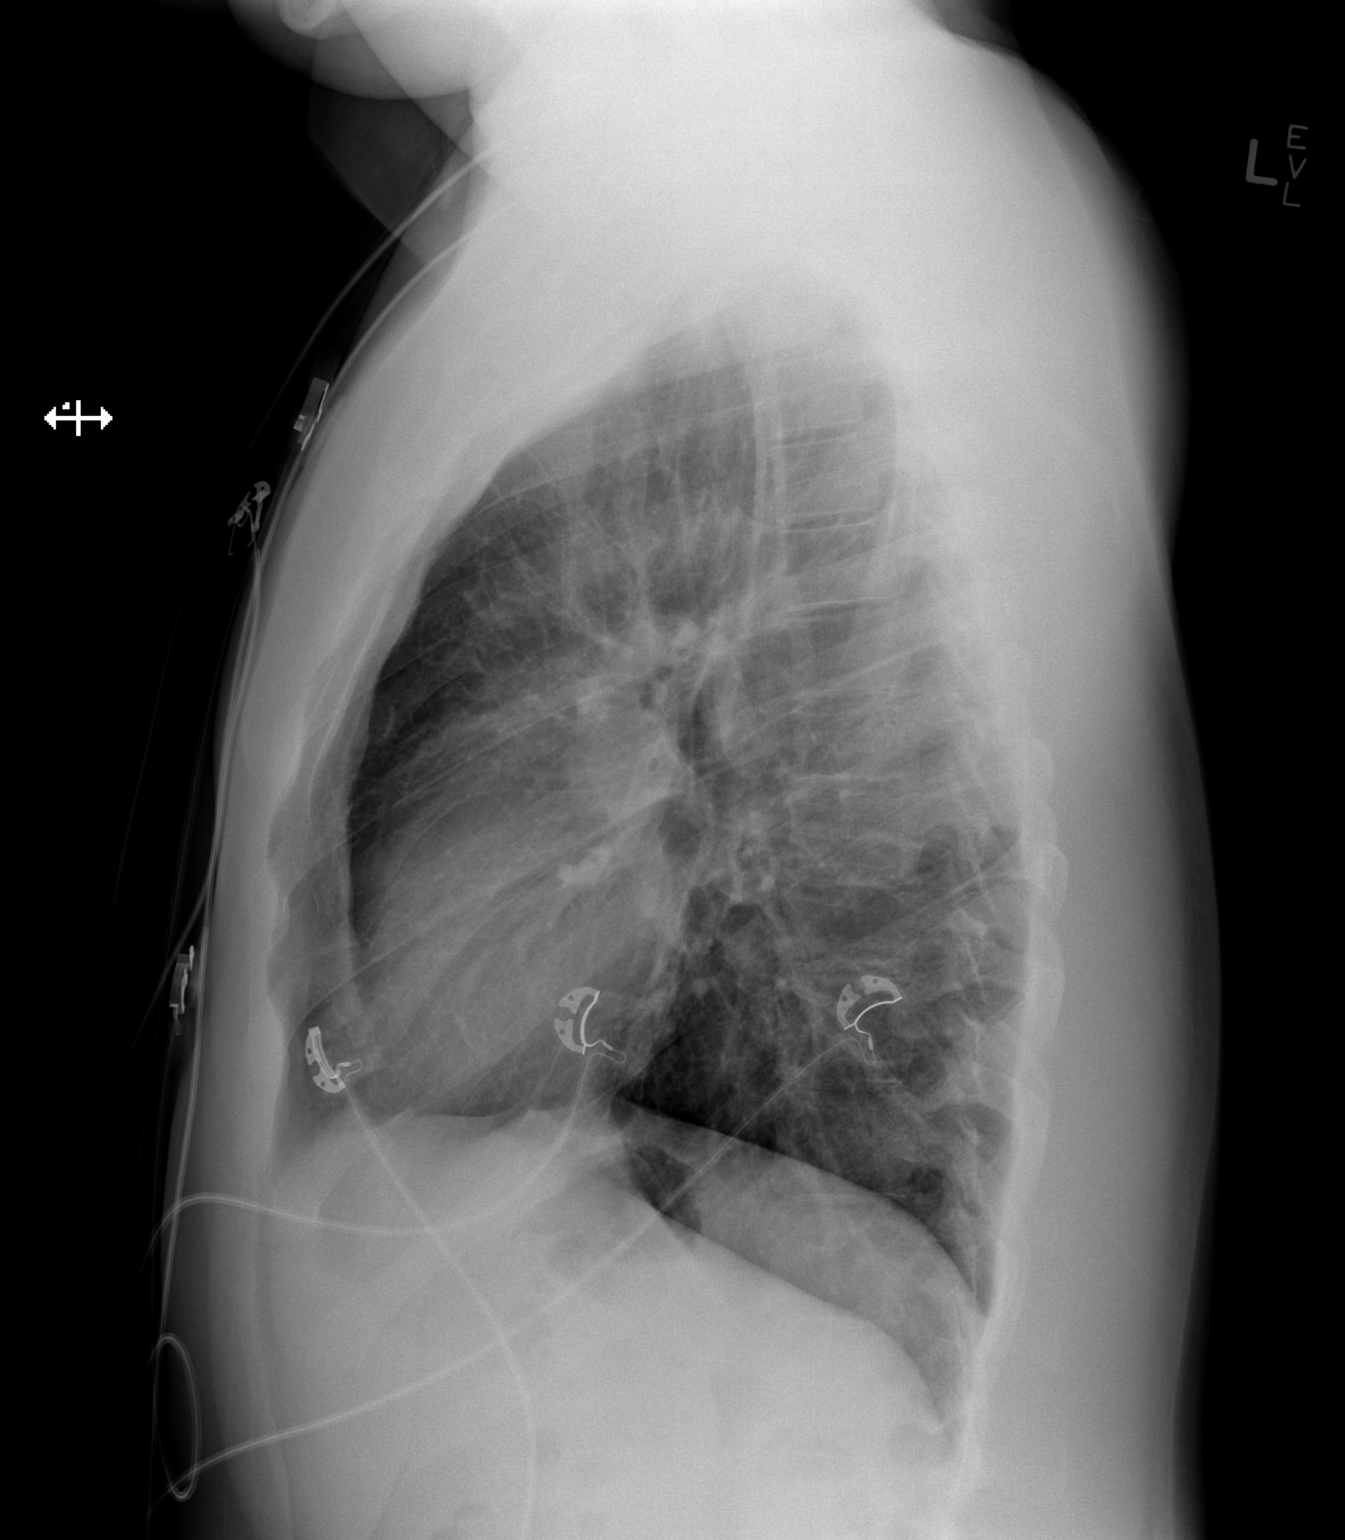

[2 of 2 positions shown; findings below may reference images not displayed]

FINDINGS: The heart size and mediastinal contours are within normal limits.
Both lungs are clear. The visualized skeletal structures are
unremarkable.
IMPRESSION: No active cardiopulmonary disease.

## 2017-07-23 ENCOUNTER — Emergency Department (HOSPITAL_COMMUNITY)
Admission: EM | Admit: 2017-07-23 | Discharge: 2017-07-24 | Disposition: A | Discharge status: Other Acute Inpatient Hospital | Payer: BC Managed Care – PPO | Attending: Emergency Medicine | Admitting: Emergency Medicine

## 2017-07-23 ENCOUNTER — Encounter (HOSPITAL_COMMUNITY): Payer: Self-pay

## 2017-07-23 DIAGNOSIS — F418 Other specified anxiety disorders: Secondary | ICD-10-CM | POA: Diagnosis not present

## 2017-07-23 DIAGNOSIS — Z79899 Other long term (current) drug therapy: Secondary | ICD-10-CM | POA: Diagnosis not present

## 2017-07-23 DIAGNOSIS — R45851 Suicidal ideations: Secondary | ICD-10-CM | POA: Diagnosis not present

## 2017-07-23 HISTORY — DX: Anxiety disorder, unspecified: F41.9

## 2017-07-23 LAB — COMPREHENSIVE METABOLIC PANEL
ALBUMIN: 4.5 g/dL (ref 3.5–5.0)
ALT: 24 U/L (ref 17–63)
ANION GAP: 10 (ref 5–15)
AST: 25 U/L (ref 15–41)
Alkaline Phosphatase: 48 U/L (ref 38–126)
BILIRUBIN TOTAL: 1.2 mg/dL (ref 0.3–1.2)
BUN: 10 mg/dL (ref 6–20)
CHLORIDE: 102 mmol/L (ref 101–111)
CO2: 25 mmol/L (ref 22–32)
Calcium: 9.4 mg/dL (ref 8.9–10.3)
Creatinine, Ser: 1.01 mg/dL (ref 0.61–1.24)
GFR calc Af Amer: >60 mL/min (ref >60)
GLUCOSE: 93 mg/dL (ref 65–99)
POTASSIUM: 3.9 mmol/L (ref 3.5–5.1)
Sodium: 137 mmol/L (ref 135–145)
TOTAL PROTEIN: 7.6 g/dL (ref 6.5–8.1)

## 2017-07-23 LAB — CBC
HCT: 45.3 % (ref 39.0–52.0)
Hemoglobin: 15.8 g/dL (ref 13.0–17.0)
MCH: 32.9 pg (ref 26.0–34.0)
MCHC: 34.9 g/dL (ref 30.0–36.0)
MCV: 94.4 fL (ref 78.0–100.0)
PLATELETS: 227 10*3/uL (ref 150–400)
RBC: 4.8 MIL/uL (ref 4.22–5.81)
RDW: 12.1 % (ref 11.5–15.5)
WBC: 7.7 10*3/uL (ref 4.0–10.5)

## 2017-07-23 LAB — SALICYLATE LEVEL: Salicylate Lvl: <7 mg/dL (ref 2.8–30.0)

## 2017-07-23 LAB — ACETAMINOPHEN LEVEL

## 2017-07-23 LAB — ETHANOL

## 2017-07-23 MED ORDER — ALUM & MAG HYDROXIDE-SIMETH 200-200-20 MG/5ML PO SUSP: 30.0000 mL | Freq: Four times a day (QID) | ORAL | Status: DC | PRN

## 2017-07-23 MED ORDER — IBUPROFEN 200 MG PO TABS: 600.0000 mg | ORAL_TABLET | Freq: Three times a day (TID) | ORAL | Status: DC | PRN

## 2017-07-23 MED ORDER — ALBUTEROL SULFATE HFA 108 (90 BASE) MCG/ACT IN AERS: 1.0000 | INHALATION_SPRAY | Freq: Four times a day (QID) | RESPIRATORY_TRACT | Status: DC | PRN

## 2017-07-23 MED ORDER — ONDANSETRON HCL 4 MG PO TABS: 4.0000 mg | ORAL_TABLET | Freq: Three times a day (TID) | ORAL | Status: DC | PRN

## 2017-07-23 MED ORDER — SERTRALINE HCL 50 MG PO TABS: 100.0000 mg | ORAL_TABLET | Freq: Every day | ORAL | Status: DC

## 2017-07-23 MED ORDER — ZOLPIDEM TARTRATE 5 MG PO TABS: 5.0000 mg | ORAL_TABLET | Freq: Every evening | ORAL | Status: DC | PRN
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
## 2017-07-23 NOTE — ED Triage Notes (Signed)
States got all on him and thought about today taking all his anxiety medication to hurt himself.

## 2017-07-24 ENCOUNTER — Encounter (HOSPITAL_COMMUNITY): Payer: Self-pay

## 2017-07-24 ENCOUNTER — Inpatient Hospital Stay (HOSPITAL_COMMUNITY)
Admission: AD | Admit: 2017-07-24 | Discharge: 2017-07-27 | DRG: 885 | Disposition: A | Discharge status: Home / Self Care | Payer: BC Managed Care – PPO | Source: Intra-hospital | Attending: Psychiatry | Admitting: Psychiatry

## 2017-07-24 DIAGNOSIS — F410 Panic disorder [episodic paroxysmal anxiety] without agoraphobia: Secondary | ICD-10-CM | POA: Diagnosis present

## 2017-07-24 DIAGNOSIS — F332 Major depressive disorder, recurrent severe without psychotic features: Secondary | ICD-10-CM | POA: Diagnosis present

## 2017-07-24 DIAGNOSIS — F418 Other specified anxiety disorders: Secondary | ICD-10-CM | POA: Diagnosis not present

## 2017-07-24 DIAGNOSIS — E739 Lactose intolerance, unspecified: Secondary | ICD-10-CM | POA: Diagnosis present

## 2017-07-24 DIAGNOSIS — F1290 Cannabis use, unspecified, uncomplicated: Secondary | ICD-10-CM | POA: Diagnosis present

## 2017-07-24 DIAGNOSIS — G479 Sleep disorder, unspecified: Secondary | ICD-10-CM | POA: Diagnosis present

## 2017-07-24 DIAGNOSIS — Z599 Problem related to housing and economic circumstances, unspecified: Secondary | ICD-10-CM | POA: Diagnosis not present

## 2017-07-24 DIAGNOSIS — Z79899 Other long term (current) drug therapy: Secondary | ICD-10-CM | POA: Diagnosis not present

## 2017-07-24 DIAGNOSIS — F411 Generalized anxiety disorder: Secondary | ICD-10-CM | POA: Diagnosis present

## 2017-07-24 DIAGNOSIS — F1210 Cannabis abuse, uncomplicated: Secondary | ICD-10-CM | POA: Diagnosis not present

## 2017-07-24 DIAGNOSIS — Z23 Encounter for immunization: Secondary | ICD-10-CM

## 2017-07-24 DIAGNOSIS — F39 Unspecified mood [affective] disorder: Secondary | ICD-10-CM | POA: Diagnosis not present

## 2017-07-24 HISTORY — DX: Depression, unspecified: F32.A

## 2017-07-24 HISTORY — DX: Major depressive disorder, single episode, unspecified: F32.9

## 2017-07-24 LAB — HEMOGLOBIN A1C
Hgb A1c MFr Bld: 5.2 % (ref 4.8–5.6)
Mean Plasma Glucose: 102.54 mg/dL

## 2017-07-24 LAB — LIPID PANEL
CHOL/HDL RATIO: 3.6 ratio
Cholesterol: 149 mg/dL (ref 0–200)
HDL: 41 mg/dL (ref >40)
LDL Cholesterol: 64 mg/dL (ref 0–99)
TRIGLYCERIDES: 218 mg/dL — AB (ref <150)
VLDL: 44 mg/dL — AB (ref 0–40)

## 2017-07-24 LAB — TSH: TSH: 1.936 u[IU]/mL (ref 0.350–4.500)

## 2017-07-24 LAB — RAPID URINE DRUG SCREEN, HOSP PERFORMED
AMPHETAMINES: NOT DETECTED
Barbiturates: NOT DETECTED
Benzodiazepines: NOT DETECTED
Cocaine: NOT DETECTED
OPIATES: NOT DETECTED
Tetrahydrocannabinol: POSITIVE — AB

## 2017-07-24 MED ORDER — ACETAMINOPHEN 325 MG PO TABS: 650.0000 mg | ORAL_TABLET | Freq: Four times a day (QID) | ORAL | Status: DC | PRN

## 2017-07-24 MED ORDER — HYDROXYZINE HCL 25 MG PO TABS
25.0000 mg | ORAL_TABLET | Freq: Three times a day (TID) | ORAL | Status: DC | PRN
  Administered 2017-07-24 – 2017-07-27 (×3): 25 mg via ORAL
  Filled 2017-07-24 (×3): qty 1

## 2017-07-24 MED ORDER — MAGNESIUM HYDROXIDE 400 MG/5ML PO SUSP: 30.0000 mL | Freq: Every day | ORAL | Status: DC | PRN

## 2017-07-24 MED ORDER — SERTRALINE HCL 50 MG PO TABS
150.0000 mg | ORAL_TABLET | Freq: Every day | ORAL | Status: DC
  Administered 2017-07-25 – 2017-07-27 (×3): 150 mg via ORAL
  Filled 2017-07-24 (×5): qty 1

## 2017-07-24 MED ORDER — MIRTAZAPINE 7.5 MG PO TABS
7.5000 mg | ORAL_TABLET | Freq: Every day | ORAL | Status: DC
  Administered 2017-07-24 – 2017-07-25 (×2): 7.5 mg via ORAL
  Filled 2017-07-24 (×6): qty 1

## 2017-07-24 MED ORDER — ALUM & MAG HYDROXIDE-SIMETH 200-200-20 MG/5ML PO SUSP: 30.0000 mL | ORAL | Status: DC | PRN

## 2017-07-24 MED ORDER — INFLUENZA VAC SPLIT QUAD 0.5 ML IM SUSY
0.5000 mL | PREFILLED_SYRINGE | INTRAMUSCULAR | Status: AC
  Administered 2017-07-25: 0.5 mL via INTRAMUSCULAR
  Filled 2017-07-24: qty 0.5

## 2017-07-24 MED ORDER — ALBUTEROL SULFATE HFA 108 (90 BASE) MCG/ACT IN AERS: 1.0000 | INHALATION_SPRAY | Freq: Four times a day (QID) | RESPIRATORY_TRACT | Status: DC | PRN

## 2017-07-24 MED ORDER — TRAZODONE HCL 50 MG PO TABS: 50.0000 mg | ORAL_TABLET | Freq: Every evening | ORAL | Status: DC | PRN

## 2017-07-24 MED ORDER — SERTRALINE HCL 100 MG PO TABS
100.0000 mg | ORAL_TABLET | Freq: Every day | ORAL | Status: DC
  Administered 2017-07-24: 100 mg via ORAL
  Filled 2017-07-24 (×3): qty 1

## 2017-07-24 MED ORDER — MIRTAZAPINE 15 MG PO TABS: 15.0000 mg | ORAL_TABLET | Freq: Every day | ORAL | Status: DC

## 2017-07-24 NOTE — Progress Notes (Signed)
D:  Patient's self inventory sheet, patient has poor sleep, no sleep medication.  Fair appetite, low energy level, poor concentration.  Rated depression, hopeless 7, anxiety 10.  Denied withdrawals.  SI, sometimes, contracts for safety.  Physical problems, upset stomach due to anxiety.  Denied physical pain.  Goal is "not feeling embarrassed to be here.  Lowering my anxiety."  No discharge plans. A:  Medications administered per MD orders.  Emotional support and encouragement given patient. R:  Denied SI and HI while talking to nurse this morning, contracts for safety.  Denied A/V hallucinations.  Patient stated he did have SI thoughts last night, but not this morning.  Stated he is "feeling better" but still has anxiety.

## 2017-07-24 NOTE — H&P (Signed)
Psychiatric Admission Assessment Adult  Patient Identification: Dakota Mcgee MRN:  [id number] Date of Evaluation:  07/24/2017 Chief Complaint:  MDD,REC,SEV Principal Diagnosis: Severe recurrent major depression without psychotic features [facility]) Diagnosis:   Patient Active Problem List   Diagnosis Date Noted  . Severe recurrent major depression without psychotic features ([place]) [F33.2] 07/24/2017  . GAD (generalized anxiety disorder) [F41.1] 07/24/2017   History of Present Illness:  07/24/17 ED [facility] Assessment: 26 y.o. male who presents to the ED voluntarily due to increased depression, anxiety, and suicidal thoughts. Pt reports he has been undergoing severe financial strains and experiences ongoing anxiety. Pt reports his anxiety is triggered by racing thoughts and he feels as if his throat is closing and he cannot breathe. Pt states he uses marijuana occassionally to assist with his anxiety. Pt reports today, PTA he contemplated suicide. Pt reports he has experienced SI in the past but states he is usually able to "rationalize with himself." Pt reports his SI was more severe today as he actually poured about 10 pills in his hand and thought about swallowing all of the pills. Pt states he has never gone this far with his SI, therefore he decided to call a friend for help who brought him to the hospital. Pt states he often feels restless. Pt states he feels he is unable to rest due to high anxiety and even when he sleeps, he wakes up feeling tired. Pt states he "just can't relax."    Today patient cofirms all information above. Single. No children. He reports that he has been dealing with depression since college. He is the first one in his family to go to college and he felt that he was the "black sheep" of the family already, so he isolated himself more from his family. His mother is the most supportive. He is openly gay and his father did not accept it well and they still do not talk about it. He  denies any sexual, physical, or emotional abuse. He reports that his anxiety is the bigger problem, but his depression has became severe recently. 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 He reports having financial stressors taht started about 2 years ago and his boyfriend left him with all the bills and now he is worried about getting evicted and if his electric bill is even $30 more than last month. He reports seeing his PCP for medication but has only tried Zoloft, and even at 100 mg is not sufficient in maintaining his anxiety and depression. He is open to medication use. He denies any any SI/HI/AVH currently and contracts for safety.  Associated Signs/Symptoms: Depression Symptoms:  depressed mood, anhedonia, hopelessness, recurrent thoughts of death, suicidal thoughts with specific plan, anxiety, panic attacks, disturbed sleep, decreased appetite, (Hypo) Manic Symptoms:  Denies Anxiety Symptoms:  Excessive Worry, Panic Symptoms, Social Anxiety, Psychotic Symptoms:  Denies PTSD Symptoms: NA Total Time spent with patient: 45 minutes  Past Psychiatric History: Undiagnosed depression and anxiety since college  Is the patient at risk to self? No.  Has the patient been a risk to self in the past 6 months? No.  Has the patient been a risk to self within the distant past? No.  Is the patient a risk to others? No.  Has the patient been a risk to others in the past 6 months? No.  Has the patient been a risk to others within the distant past? No.   Prior Inpatient Therapy:  Prior Outpatient Therapy:    Alcohol Screening: 1. How often do you have a drink containing alcohol?: Monthly or less 2. How many drinks containing alcohol do you have on a typical day when you are drinking?: 1 or 2 3. How often do you have six or more drinks on one occasion?: Never Preliminary Score: 0 9. Have you or someone else been injured as a result of your  drinking?: No 10. Has a relative or friend or a doctor or another health worker been concerned about your drinking or suggested you cut down?: No Alcohol Use Disorder Identification Test Final Score (AUDIT): 1 Substance Abuse History in the last 12 months:  Yes.   Consequences of Substance Abuse: Medical Consequences:  reviewed Legal Consequences:  reviewed Family Consequences:  reviewed Previous Psychotropic Medications: Yes - Zoloft Psychological Evaluations: No  Past Medical History:  Past Medical History:  Diagnosis Date  . Anxiety   . Bronchitis   . Depression    History reviewed. No pertinent surgical history. Family History: History reviewed. No pertinent family history. Family Psychiatric  History: Sister - Bipolar Tobacco Screening: Have you used any form of tobacco in the last 30 days? (Cigarettes, Smokeless Tobacco, Cigars, and/or Pipes): No Social History:  History  Alcohol Use  . 0.6 oz/week  . 1 Shots of liquor per week    Comment: 1 drink a month     History  Drug Use  . Frequency: 1.0 time per week  . Types: Marijuana    Comment: shares a blunt 2 to 3 times a month    Additional Social History:      Pain Medications: NA Prescriptions: never abused Over the Counter: NA Longest period of sobriety (when/how long): per pt. only drinks 1 beer a month Name of Substance 1: Marijuana  1 - Age of First Use: 24 1 - Amount (size/oz): pt reports he shares a blunt 1 - Frequency: 2 to 3 times a month 1 - Duration: ongoing 1 - Last Use / Amount: 07/21/2017 Name of Substance 2: Alcohol 2 - Age of First Use: 20 2 - Amount (size/oz): less than 1 shot 2 - Frequency: once a month 2 - Duration: ongoing 2 - Last Use / Amount: 3 weeks ago                Allergies:   Allergies  Allergen Reactions  . Lactose Intolerance (Gi)    Lab Results:  Results for orders placed or performed during the hospital encounter of 07/24/17 (from the past 48 hour(s))  Hemoglobin  A1c     Status: None   Collection Time: 07/24/17  6:21 AM  Result Value Ref Range   Hgb A1c MFr Bld 5.2 4.8 - 5.6 %    Comment: (NOTE) Pre diabetes:          5.7%-6.4% Diabetes:              >6.4% Glycemic control for   <7.0% adults with diabetes    Mean Plasma Glucose 102.54 mg/dL    Comment: Performed at [facility], [address] [place], [place] [address]  Lipid panel     Status: Abnormal   Collection Time: 07/24/17  6:21 AM  Result Value Ref Range   Cholesterol 149 0 - 200 mg/dL   Triglycerides 218 (H) <150 mg/dL   HDL 41 >40 mg/dL   Total CHOL/HDL Ratio 3.6 RATIO   VLDL 44 (H) 0 - 40 mg/dL   LDL Cholesterol  64 0 - 99 mg/dL    Comment:        Total Cholesterol/HDL:CHD Risk Coronary Heart Disease Risk Table                     Men   Women  1/2 Average Risk   3.4   3.3  Average Risk       5.0   4.4  2 X Average Risk   9.6   7.1  3 X Average Risk  23.4   11.0        Use the calculated Patient Ratio above and the CHD Risk Table to determine the patient's CHD Risk.        ATP III CLASSIFICATION (LDL):  <100     mg/dL   Optimal  003-496  mg/dL   Near or Above                    Optimal  130-159  mg/dL   Borderline  116-435  mg/dL   High  >391     mg/dL   Very High Performed at [facility], [address], [place], [place] [address]   TSH     Status: None   Collection Time: 07/24/17  6:21 AM  Result Value Ref Range   TSH 1.936 0.350 - 4.500 uIU/mL    Comment: Performed by a 3rd Generation assay with a functional sensitivity of <=0.01 uIU/mL. Performed at [facility], [address], [place], [place] [address]     Blood Alcohol level:  Lab Results  Component Value Date   ETH <10 07/23/2017    Metabolic Disorder Labs:  Lab Results  Component Value Date   HGBA1C 5.2 07/24/2017   MPG 102.54 07/24/2017   No results found for: PROLACTIN Lab Results  Component Value Date   CHOL 149 07/24/2017   TRIG 218 (H) 07/24/2017    HDL 41 07/24/2017   CHOLHDL 3.6 07/24/2017   VLDL 44 (H) 07/24/2017   LDLCALC 64 07/24/2017    Current Medications: Current Facility-Administered Medications  Medication Dose Route Frequency Provider Last Rate Last Dose  . acetaminophen (TYLENOL) tablet 650 mg  650 mg Oral Q6H PRN Nira Conn A, NP      . albuterol (PROVENTIL HFA;VENTOLIN HFA) 108 (90 Base) MCG/ACT inhaler 1-2 puff  1-2 puff Inhalation Q6H PRN Nira Conn A, NP      . alum & mag hydroxide-simeth (MAALOX/MYLANTA) 200-200-20 MG/5ML suspension 30 mL  30 mL Oral Q4H PRN Nira Conn A, NP      . hydrOXYzine (ATARAX/VISTARIL) tablet 25 mg  25 mg Oral TID PRN Nira Conn A, NP   25 mg at 07/24/17 0800  . [START ON 07/25/2017] Influenza vac split quadrivalent PF (FLUARIX) injection 0.5 mL  0.5 mL Intramuscular Tomorrow-1000 Cobos, Fernando A, MD      . magnesium hydroxide (MILK OF MAGNESIA) suspension 30 mL  30 mL Oral Daily PRN Nira Conn A, NP      . sertraline (ZOLOFT) tablet 100 mg  100 mg Oral Daily Nira Conn A, NP   100 mg at 07/24/17 0759  . traZODone (DESYREL) tablet 50 mg  50 mg Oral QHS PRN Jackelyn Poling, NP       PTA Medications: Prescriptions Prior to Admission  Medication Sig Dispense Refill Last Dose  . albuterol (PROVENTIL HFA;VENTOLIN HFA) 108 (90 BASE) MCG/ACT inhaler Inhale 1-2 puffs into the lungs every 6 (six) hours as  needed for wheezing or shortness of breath. 1 Inhaler 0 07/22/2017 at Unknown time  . sertraline (ZOLOFT) 100 MG tablet Take 100 mg by mouth daily.  5 07/23/2017 at Unknown time    Musculoskeletal: Strength & Muscle Tone: within normal limits Gait & Station: normal Patient leans: N/A  Psychiatric Specialty Exam: Physical Exam  Vitals reviewed. Constitutional: He is oriented to person, place, and time. He appears well-developed and well-nourished.  Cardiovascular: Normal rate.   Respiratory: Effort normal.  Musculoskeletal: Normal range of motion.  Neurological: He is alert and  oriented to person, place, and time.  Skin: Skin is warm.    Review of Systems  Constitutional: Negative.   HENT: Negative.   Eyes: Negative.   Respiratory: Negative.   Cardiovascular: Negative.   Gastrointestinal: Negative.   Genitourinary: Negative.   Musculoskeletal: Negative.   Skin: Negative.   Neurological: Negative.   Endo/Heme/Allergies: Negative.   Psychiatric/Behavioral: Positive for depression, substance abuse and suicidal ideas. Negative for hallucinations. The patient is nervous/anxious.     Blood pressure 132/84, pulse 79, temperature 98.2 F (36.8 C), temperature source Oral, resp. rate 16, height $RemoveBe'5\' 8"'KrIFmdIlG$  (1.727 m), weight 85.3 kg (188 lb).Body mass index is 28.59 kg/m.  General Appearance: Casual  Eye Contact:  Good  Speech:  Clear and Coherent and Normal Rate  Volume:  Normal  Mood:  Depressed  Affect:  Flat  Thought Process:  Goal Directed and Descriptions of Associations: Intact  Orientation:  Full (Time, Place, and Person)  Thought Content:  WDL  Suicidal Thoughts:  No  Homicidal Thoughts:  No  Memory:  Immediate;   Good Recent;   Good Remote;   Good  Judgement:  Good  Insight:  Good  Psychomotor Activity:  Normal  Concentration:  Concentration: Good and Attention Span: Good  Recall:  Good  Fund of Knowledge:  Good  Language:  Good  Akathisia:  No  Handed:  Right  AIMS (if indicated):     Assets:  Communication Skills Desire for Improvement Financial Resources/Insurance [place] Talents/Skills Transportation Vocational/Educational  ADL's:  Intact  Cognition:  WNL  Sleep:       Treatment Plan Summary: Daily contact with patient to assess and evaluate symptoms and progress in treatment, Medication management and Plan is to:  -See SRA and MAR for medication management -Encourage group therapy participation  Observation Level/Precautions:  15 minute checks  Laboratory:  Reviewed  Psychotherapy:     Medications:    Consultations:    Discharge Concerns:    Estimated LOS:  Other:     Physician Treatment Plan for Primary Diagnosis: Severe recurrent major depression without psychotic features ([place]) Long Term Goal(s): Improvement in symptoms so as ready for discharge  Short Term Goals: Ability to verbalize feelings will improve and Ability to disclose and discuss suicidal ideas  Physician Treatment Plan for Secondary Diagnosis: Principal Problem:   Severe recurrent major depression without psychotic features ([place]) Active Problems:   GAD (generalized anxiety disorder)  Long Term Goal(s): Improvement in symptoms so as ready for discharge  Short Term Goals: Ability to identify and develop effective coping behaviors will improve and Ability to identify triggers associated with substance abuse/mental health issues will improve  I certify that inpatient services furnished can reasonably be expected to improve the patient's condition.    Lewis Shock, FNP 10/22/201812:49 PM   I have discussed case with NP and have met with patient Agree with NP assessment  65 year old  single male, no children, has two roommates , employed Education officer, [employer].  Presented to ED voluntarily due to depression, anxiety, suicidal ideations, with thoughts of overdosing on medications . Describes neuro -vegetative symptoms such as decreased appetite, decreased energy, decreased sleep, some anhedonia. Denies psychotic symptoms.  States he has history of depression, which has been intermittent. Recently feeling more depressed in the context of financial difficulties . He also states that he has been more distant from family so feels he has less of a support system recently. Describes occasional cannabis use, but not frequently, denies other drug or alcohol abuse. He is prescribed Zoloft which he started in January 2018. Denies medical illnesses . NKDA.  Dx- MDD, no psychotic features.   Plan- Inpatient admission.  Increase Zoloft to 150 mgr QDAY, and agrees to start Remeron 7.5 mgrs QHS

## 2017-07-24 NOTE — Progress Notes (Signed)
Adult Psychoeducational Group Note  Date:  07/24/2017 Time:  8:59 PM  Group Topic/Focus:  Wrap-Up Group:   The focus of this group is to help patients review their daily goal of treatment and discuss progress on daily workbooks.  Participation Level:  Active  Participation Quality:  Appropriate and Attentive  Affect:  Appropriate  Cognitive:  Alert and Appropriate  Insight: Appropriate  Engagement in Group:  Engaged  Modes of Intervention:  Discussion and Support  Additional Comments:  Patient reports this was his first full day. Patient states, "I was feeling embarrassed about being here but realized I am here for a reason". Patient reports his day improved as it went on and that he felt "very supported and welcomed by the group". Patient states his goal is to "be more open minded tomorrow".  Rae Lipsmanda A Fidelia Cathers 07/24/2017, 8:59 PM

## 2017-07-24 NOTE — BH Assessment (Addendum)
Assessment Note  Dakota Mcgee is an 26 y.o. male who presents to the ED voluntarily due to increased depression, anxiety, and suicidal thoughts. Pt reports he has been undergoing severe financial strains and experiences ongoing anxiety. Pt reports his anxiety is triggered by racing thoughts and he feels as if his throat is closing and he cannot breathe. Pt states he uses marijuana occassionally to assist with his anxiety. Pt reports today, PTA he contemplated suicide. Pt reports he has experienced SI in the past but states he is usually able to "rationalize with himself." Pt reports his SI was more severe today as he actually poured about 10 pills in his hand and thought about swallowing all of the pills. Pt states he has never gone this far with his SI, therefore he decided to call a friend for help who brought him to the hospital. Pt states he often feels restless. Pt states he feels he is unable to rest due to high anxiety and even when he sleeps, he wakes up feeling tired. 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 Pt reports he feels support from his friends and he has a hx of OPT counseling during college at [employer]. Pt states his PCP prescribes his psych medication to him and he states he has never seen a psychiatrist.   Pt denies HI and denies AVH. Pt denies any prior inpt hospitalization. Pt states he is willing to sign voluntary consent for treatment.   Nira Conn, NP recommends inpt treatment. Pt's nurse Junior, RN notified of recommendation.   Diagnosis: MDD, recurrent, w/o psychosis; GAD  Past Medical History:  Past Medical History:  Diagnosis Date  . Anxiety   . Bronchitis     History reviewed. No pertinent surgical history.  Family History: History reviewed. No pertinent family history.  Social History:  reports that he has never smoked. He has never used smokeless tobacco. He reports that he does not drink alcohol or use  drugs.  Additional Social History:  Alcohol / Drug Use Pain Medications: See MAR Prescriptions: See MAR Over the Counter: See MAR History of alcohol / drug use?: Yes Substance #1 Name of Substance 1: Marijuana  1 - Age of First Use: 24 1 - Amount (size/oz): pt reports he shares a blunt  1 - Frequency: 1x/week 1 - Duration: ongoing 1 - Last Use / Amount: 07/21/17 Substance #2 Name of Substance 2: Alcohol 2 - Age of First Use: 20 2 - Amount (size/oz): less than 1 shot 2 - Frequency: 2x/ month 2 - Duration: ongoing 2 - Last Use / Amount: 3 weeks ago  CIWA: CIWA-Ar BP: (!) 150/84 Pulse Rate: 68 COWS:    Allergies:  Allergies  Allergen Reactions  . Lactose Intolerance (Gi)     Home Medications:  (Not in a hospital admission)  OB/GYN Status:  No LMP for male patient.  General Assessment Data Location of Assessment: [facility] TTS Assessment: In system Is this a Tele or Face-to-Face Assessment?: Face-to-Face Is this an Initial Assessment or a Re-assessment for this encounter?: Initial Assessment Marital status: Single Is patient pregnant?: No Pregnancy Status: No Living Arrangements: Alone Can pt return to current living arrangement?: Yes Admission Status: Voluntary Is patient capable of signing voluntary admission?: Yes Referral Source: Self/Family/Friend Insurance type: BCBS     Crisis Care Plan Living Arrangements: Alone Name of Psychiatrist: none Name of Therapist: none  Education Status Is patient currently in school?: No Highest  grade of school patient has completed: College graduate   Risk to self with the past 6 months Suicidal Ideation: Yes-Currently Present Has patient been a risk to self within the past 6 months prior to admission? : Yes Suicidal Intent: Yes-Currently Present Has patient had any suicidal intent within the past 6 months prior to admission? : Yes Is patient at risk for suicide?: Yes Suicidal Plan?: Yes-Currently Present Has patient  had any suicidal plan within the past 6 months prior to admission? : Yes Specify Current Suicidal Plan: pt reports a plan to OD on his medication  Access to Means: Yes Specify Access to Suicidal Means: pt has access to medication  What has been your use of drugs/alcohol within the last 12 months?: reports to occasional marijuana and rare alcohol use  Previous Attempts/Gestures: No Triggers for Past Attempts: None known Intentional Self Injurious Behavior: None Family Suicide History: No Recent stressful life event(s): Financial Problems Persecutory voices/beliefs?: No Depression: Yes Depression Symptoms: Despondent, Insomnia, Fatigue, Loss of interest in usual pleasures, Feeling worthless/self pity Substance abuse history and/or treatment for substance abuse?: No Suicide prevention information given to non-admitted patients: Not applicable  Risk to Others within the past 6 months Homicidal Ideation: No Does patient have any lifetime risk of violence toward others beyond the six months prior to admission? : No Thoughts of Harm to Others: No Current Homicidal Intent: No Current Homicidal Plan: No Access to Homicidal Means: No History of harm to others?: No Assessment of Violence: None Noted Does patient have access to weapons?: No Criminal Charges Pending?: No Does patient have a court date: No Is patient on probation?: No  Psychosis Hallucinations: None noted Delusions: None noted  Mental Status Report Appearance/Hygiene: In scrubs, Unremarkable Eye Contact: Good Motor Activity: Freedom of movement Speech: Logical/coherent Level of Consciousness: Alert Mood: Depressed, Pleasant, Anxious, Helpless Affect: Anxious, Depressed Anxiety Level: Moderate Thought Processes: Relevant, Coherent Judgement: Impaired Orientation: Person, Time, Place, Situation, Appropriate for developmental age Obsessive Compulsive Thoughts/Behaviors: None  Cognitive Functioning Concentration:  Normal Memory: Remote Intact, Recent Intact IQ: Average Insight: Fair Impulse Control: Fair Appetite: Good Sleep: Decreased Total Hours of Sleep: 6 Vegetative Symptoms: None  ADLScreening [facility] Assessment Services) Patient's cognitive ability adequate to safely complete daily activities?: Yes Patient able to express need for assistance with ADLs?: Yes Independently performs ADLs?: Yes (appropriate for developmental age)  Prior Inpatient Therapy Prior Inpatient Therapy: No  Prior Outpatient Therapy Prior Outpatient Therapy: Yes Prior Therapy Dates: 2016 Prior Therapy Facilty/Provider(s): [employer] Reason for Treatment: COUNSELING Does patient have an ACCT team?: No Does patient have Intensive In-House Services?  : No Does patient have [facility] services? : No Does patient have P4CC services?: No  ADL Screening (condition at time of admission) Patient's cognitive ability adequate to safely complete daily activities?: Yes Is the patient deaf or have difficulty hearing?: No Does the patient have difficulty seeing, even when wearing glasses/contacts?: No Does the patient have difficulty concentrating, remembering, or making decisions?: No Patient able to express need for assistance with ADLs?: Yes Does the patient have difficulty dressing or bathing?: No Independently performs ADLs?: Yes (appropriate for developmental age) Does the patient have difficulty walking or climbing stairs?: No Weakness of Legs: None Weakness of Arms/Hands: None  Home Assistive Devices/Equipment Home Assistive Devices/Equipment: None    Abuse/Neglect Assessment (Assessment to be complete while patient is alone) Physical Abuse: Denies Verbal Abuse: Denies Sexual Abuse: Denies Exploitation of patient/patient's resources: Denies Self-Neglect: Denies     Advance  Directives (For Healthcare) Does Patient Have a Medical Advance Directive?: No Would patient like information on creating a  medical advance directive?: No - Patient declined    Additional Information 1:1 In Past 12 Months?: No CIRT Risk: No Elopement Risk: No Does patient have medical clearance?: Yes     Disposition:  Disposition Initial Assessment Completed for this Encounter: Yes Disposition of Patient: Inpatient treatment program Type of inpatient treatment program: Adult (Per Nira ConnJason Berry, NP)  On Site Evaluation by:   Reviewed with Physician:    Karolee OhsAquicha R Vannie Hilgert 07/24/2017 12:59 AM

## 2017-07-24 NOTE — Tx Team (Cosign Needed)
Initial Treatment Plan 07/24/2017 4:02 AM Dakota Mcgee DGU:[id number]    PATIENT STRESSORS: Financial difficulties   PATIENT STRENGTHS: Ability for insight Active sense of humor Average or above average intelligence Capable of independent living Communication skills Supportive family/friends Work skills   PATIENT IDENTIFIED PROBLEMS: Anxiety and depression with SI  Panic attacks  "learn to cope better"  "be able to have a better outlook on things, pushing through wears me down"               DISCHARGE CRITERIA:  Improved stabilization in mood, thinking, and/or behavior Need for constant or close observation no longer present Verbal commitment to aftercare and medication compliance  PRELIMINARY DISCHARGE PLAN: Outpatient therapy Return to previous living arrangement Return to previous work or school arrangements  PATIENT/FAMILY INVOLVEMENT: This treatment plan has been presented to and reviewed with the patient, Dakota Mcgee, and/or family member, .  The patient and family have been given the opportunity to ask questions and make suggestions.  Vanetta ShawlSadler, Ceairra Mccarver Roberts GaudyJean Horne, RN 07/24/2017, 4:02 AM

## 2017-07-24 NOTE — Progress Notes (Signed)
Patient ID: Quincy SheehanBrandon Alcala, male   DOB: [birth year], 26 y.o.   MRN: [id number]  Pt currently presents with a pleasant affect and guarded behavior. Pt reports to writer that their goal is to "get out of my head and stop overthinkin' things." Pt states "I want to be more social. It is hard to make friends now that I'm not in school anymore." Pt reports poor sleep prior to admission, reports the he would is hopeful Remeron will help him get to sleep and stay asleep too.   Pt provided with medications per providers orders. Pt's labs and vitals were monitored throughout the night. Pt given a 1:1 about emotional and mental status. Pt supported and encouraged to express concerns and questions. Pt educated on medications, CBT and fall prevention safety. Expresses verbal understanding.   Pt's safety ensured with 15 minute and environmental checks. Pt currently denies SI/HI and A/V hallucinations. Pt verbally agrees to seek staff if SI/HI or A/VH occurs and to consult with staff before acting on any harmful thoughts. Will continue POC.
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
## 2017-07-24 NOTE — Progress Notes (Signed)
Pt. Is a 26 year old male that admits to SI thoughts of OD on his zoloft but called a friend for support instead of taking the pills. Pt. Reports he has history of depression and anxiety that has increased d/t financial stressors.  Pt. Reports that he had a boyfriend that he lived with and moved out leaving the patient with multiple debts.  Pt. Also helps out others in need causing self financial stressors.  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 Pt. Is also attending Grad school.  Pt. States he drinks appr. 1 beer a month and smokes 1 shared blunt of THC once or twice a month. Pt. Appears anxious and sad on admission.  Pt. Is pleasant and cooperative.  His home medications include zoloft and his inhaler for his bronchitis.  Pt. Was offered food and fluids and was oriented to the adult unit as he was escorted to the 400 hall.  This is his first inpatient admission.

## 2017-07-24 NOTE — B[facility] Group Notes (Signed)
[name] Group Therapy Note   07/24/2017 1:15pm   Type of Therapy and Topic:  Group Therapy:  Overcoming Obstacles   Participation Level: Active   Description of Group:    In this group patients will be encouraged to explore what they see as obstacles to their own wellness and recovery. They will be guided to discuss their thoughts, feelings, and behaviors related to these obstacles. The group will process together ways to cope with barriers, with attention given to specific choices patients can make. Each patient will be challenged to identify changes they are motivated to make in order to overcome their obstacles. This group will be process-oriented, with patients participating in exploration of their own experiences as well as giving and receiving support and challenge from other group members.   Therapeutic Goals: 1. Patient will identify personal and current obstacles as they relate to admission. 2. Patient will identify barriers that currently interfere with their wellness or overcoming obstacles.  3. Patient will identify feelings, thought process and behaviors related to these barriers. 4. Patient will identify two changes they are willing to make to overcome these obstacles:      Therapeutic Modalities:   Cognitive Behavioral Therapy Solution Focused Therapy Motivational Interviewing Relapse Prevention Therapy  Dakota JordanLynn B [name], [name], LCSWA 07/24/2017 4:14 PM

## 2017-07-24 NOTE — Plan of Care (Signed)
Problem: Education: Goal: Utilization of techniques to improve thought processes will improve Outcome: Progressing Nurse discussed depression/anxiety/coping skills with patient.    

## 2017-07-24 NOTE — B[facility] Suicide Risk Assessment (Addendum)
[facility]   Nursing information obtained from:  Patient Demographic factors:  Male, Dakota PeachGay, lesbian, or bisexual orientation, Living alone Current Mental Status:  NA Loss Factors:  Financial problems / change in socioeconomic status Historical Factors:  NA Risk Reduction Factors:  Positive social support  Total Time spent with patient: 45 minutes Principal Problem: Severe recurrent major depression without psychotic features (HCC) Diagnosis:   Patient Active Problem List   Diagnosis Date Noted  . Severe recurrent major depression without psychotic features (HCC) [F33.2] 07/24/2017  . GAD (generalized anxiety disorder) [F41.1] 07/24/2017    Continued Clinical Symptoms:  Alcohol Use Disorder Identification Test Final Score (AUDIT): 1 The "Alcohol Use Disorders Identification Test", Guidelines for Use in Primary Care, Second Edition.  World Science writerHealth Organization [facility](WHO). Score between 0-7:  no or low risk or alcohol related problems. Score between 8-15:  moderate risk of alcohol related problems. Score between 16-19:  high risk of alcohol related problems. Score 20 or above:  warrants further diagnostic evaluation for alcohol dependence and treatment.   CLINICAL FACTORS:  26 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, no children, has two roommates , employed Engineer, siteschool teacher.  Presented to ED voluntarily due to depression, anxiety, suicidal ideations, with thoughts of overdosing on medications . Describes neuro -vegetative symptoms such as decreased appetite, decreased energy, decreased sleep, some anhedonia. Denies psychotic symptoms.  States he has history of depression, which has been intermittent. Recently feeling more depressed in the context of financial difficulties . He also states that he has been more distant from family so feels he has less of a support system recently. Describes occasional cannabis use, but not frequently, denies other drug or alcohol abuse. He is prescribed  Zoloft which he started in January 2018. Denies medical illnesses . NKDA.  Dx- MDD, no psychotic features.   Plan- Inpatient admission. Increase Zoloft to 150 mgr QDAY, and agrees to start Remeron 7.5 mgrs QHS       Musculoskeletal: Strength & Muscle Tone: within normal limits Gait & Station: normal Patient leans: N/A  Psychiatric Specialty Exam: Physical Exam  ROSdenies headache, no chest pain, no shortness of breath, no vomiting   Blood pressure 132/84, pulse 79, temperature 98.2 F (36.8 C), temperature source Oral, resp. rate 16, height 5\' 8"  (1.727 m), weight 85.3 kg (188 lb).Body mass index is 28.59 kg/m.  General Appearance: Fairly Groomed  Eye Contact:  Good  Speech:  Normal Rate  Volume:  Normal  Mood:  depressed , but states he is feeling better than before admission  Affect:  Appropriate, somewhat constricted.  Thought Process:  Linear and Descriptions of Associations: Intact  Orientation:  Full (Time, Place, and Person)  Thought Content:  no hallucinations, no delusions, not internally preoccupied   Suicidal Thoughts:  No  Denies any current suicidal or self injurious ideations, no homicidal ideations   Homicidal Thoughts:  No  Memory:  recent and remote grossly intact   Judgement:  Fair  Insight:  Fair  Psychomotor Activity:  Normal  Concentration:  Concentration: Good and Attention Span: Good  Recall:  Good  Fund of Knowledge:  Good  Language:  Good  Akathisia:  Negative  Handed:  Right  AIMS (if indicated):     Assets:  Communication Skills Desire for Improvement Resilience  ADL's:  Intact  Cognition:  WNL  Sleep:         COGNITIVE FEATURES THAT CONTRIBUTE TO RISK:  Closed-mindedness and Loss of executive function    SUICIDE  RISK:   Moderate:  Frequent suicidal ideation with limited intensity, and duration, some specificity in terms of plans, no associated intent, good self-control, limited dysphoria/symptomatology, some risk factors present,  and identifiable protective factors, including available and accessible social support.  PLAN OF CARE: Patient will be admitted to inpatient psychiatric unit for stabilization and safety. Will provide and encourage milieu participation. Provide medication management and maked adjustments as needed.  Will follow daily.    I certify that inpatient services furnished can reasonably be expected to improve the patient's condition.   Craige Cotta, MD 07/24/2017, 4:22 PM

## 2017-07-24 NOTE — Progress Notes (Signed)
Recreation Therapy Notes  Date:  07/24/17 Time: 0930 Location: 300 Hall Dayroom  Group Topic: Stress Management  Goal Area(s) Addresses:  Patient will verbalize importance of using healthy stress management.  Patient will identify positive emotions associated with healthy stress management.   Intervention: Stress Management  Activity :  Meditation.  LRT introduced the stress management technique of meditation.  LRT played a meditation leading patients through a body scan that allowed them to take note of the sensations they may be feeling.  Patients were to follow along as meditation played.  Education:  Stress Management, Discharge Planning.   Education Outcome: Acknowledges edcuation/In group clarification offered/Needs additional education  Clinical Observations/Feedback: Pt did not attend group.   Allysson Rinehimer, LRT/CTRS         Azarah Dacy A 07/24/2017 11:08 AM 

## 2017-07-24 NOTE — [facility] Assessment (Signed)
[facility]  Nira ConnJason Berry, NP recommends inpt treatment. Pt's nurse Junior, RN notified of recommendation. TTS attempted to contact EDP Garlon HatchetSanders, Lisa M, PA-C to notify of disposition but did not receive an answer. Nurse secretary attempted to connect to EDP but line rang several times and routed back to Secondary school teachernurse secretary.  Princess BruinsAquicha Jaylyn Booher, [name], [name] Therapeutic Triage Specialist  [phone]

## 2017-07-24 NOTE — BH Assessment (Signed)
[facility] Assessment Progress Note   [facility] Nira ConnJason Berry, NP. Attending will be Dr. Jama Flavorsobos, MD. Call to report [phone]. Bed is available now. Junior, RN [facility]. [facility].  Dakota Mcgee, [name], [name] Therapeutic Triage Specialist  [phone]

## 2017-07-25 NOTE — Progress Notes (Signed)
D:  Patient's self inventory sheet, patient sleeps good, sleep medication helpful.  Good appetite, low energy level, good concentration.  Rated depression and hopeless 4, anxiety 6.  Denied withdrawals.  Denied SI.  Physical problems, tired (medication possibly).  Denied physical pain.  Goal is participate in activities, socialize more.  Plans to keep up with schedule. A:  Medications administered per MD orders.  Emotional support and encouragement given patient. R:  Denied SI and HI, contracts for safety.  Denied A/V hallucinations.  Safety maintained with 15 minute checks.

## 2017-07-25 NOTE — BHH Group Notes (Signed)
The focus of this group is to educate the patient on the purpose and policies of crisis stabilization and provide a format to answer questions about their admission.  The group details unit policies and expectations of patients while admitted.  Patient attended 0900 nurse education orientation group this morning.  Patient actively participated and had appropriate affect.  Patient is alert.  Patient has appropriate insight and appropriate engagement.  Today patient will work on 3 goals for insight.    

## 2017-07-25 NOTE — Progress Notes (Signed)
[facility] MD Progress Note  07/25/2017 12:53 PM Dakota Mcgee  MRN:  [id number] Subjective: patient states he is feeling better today. Reports some lingering depression, anxiety, but states he feels better , which he states is partially due to being in hospital and away from daily stressors. Denies suicidal ideations at this time. Denies medication side effects. Objective : I have discussed case with treatment team and have met with patient. Patient is presenting with partially improved mood, but remains vaguely constricted and anxious. As above, denies suicidal ideations, and remains future oriented. 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 Presents pleasant, polite, vaguely anxious  on approach, no disruptive or agitated on unit. At this time denies medication side effects.   Principal Problem: Severe recurrent major depression without psychotic features (HCC) Diagnosis:   Patient Active Problem List   Diagnosis Date Noted  . Severe recurrent major depression without psychotic features (HCC) [F33.2] 07/24/2017  . GAD (generalized anxiety disorder) [F41.1] 07/24/2017   Total Time spent with patient: 20 minutes  Past Medical History:  Past Medical History:  Diagnosis Date  . Anxiety   . Bronchitis   . Depression    History reviewed. No pertinent surgical history. Family History: History reviewed. No pertinent family history. Social History:  History  Alcohol Use  . 0.6 oz/week  . 1 Shots of liquor per week    Comment: 1 drink a month     History  Drug Use  . Frequency: 1.0 time per week  . Types: Marijuana    Comment: shares a blunt 2 to 3 times a month    Social History   Social History  . Marital status: Single    Spouse name: N/A  . Number of children: N/A  . Years of education: N/A   Social History Main Topics  . Smoking status: Never Smoker  . Smokeless  tobacco: Never Used  . Alcohol use 0.6 oz/week    1 Shots of liquor per week     Comment: 1 drink a month  . Drug use: Yes    Frequency: 1.0 time per week    Types: Marijuana     Comment: shares a blunt 2 to 3 times a month  . Sexual activity: Yes    Birth control/ protection: None   Other Topics Concern  . None   Social History Narrative  . None   Additional Social History:    Pain Medications: NA Prescriptions: never abused Over the Counter: NA Longest period of sobriety (when/how long): per pt. only drinks 1 beer a month Name of Substance 1: Marijuana  1 - Age of First Use: 24 1 - Amount (size/oz): pt reports he shares a blunt 1 - Frequency: 2 to 3 times a month 1 - Duration: ongoing 1 - Last Use / Amount: 07/21/2017 Name of Substance 2: Alcohol 2 - Age of First Use: 20 2 - Amount (size/oz): less than 1 shot 2 - Frequency: once a month 2 - Duration: ongoing 2 - Last Use / Amount: 3 weeks ago  Sleep: improved  Appetite:  improving   Current Medications: Current Facility-Administered Medications  Medication Dose Route Frequency Provider Last Rate Last Dose  . acetaminophen (TYLENOL) tablet 650 mg  650 mg Oral Q6H PRN Nira Conn A, NP      . albuterol (PROVENTIL HFA;VENTOLIN HFA) 108 (90 Base) MCG/ACT inhaler  1-2 puff  1-2 puff Inhalation Q6H PRN Lindon Romp A, NP      . alum & mag hydroxide-simeth (MAALOX/MYLANTA) 200-200-20 MG/5ML suspension 30 mL  30 mL Oral Q4H PRN Lindon Romp A, NP      . hydrOXYzine (ATARAX/VISTARIL) tablet 25 mg  25 mg Oral TID PRN Lindon Romp A, NP   25 mg at 07/25/17 0811  . magnesium hydroxide (MILK OF MAGNESIA) suspension 30 mL  30 mL Oral Daily PRN Lindon Romp A, NP      . mirtazapine (REMERON) tablet 7.5 mg  7.5 mg Oral QHS Cobos, Myer Peer, MD   7.5 mg at 07/24/17 2159  . sertraline (ZOLOFT) tablet 150 mg  150 mg Oral Daily Cobos, Myer Peer, MD   150 mg at 07/25/17 0807    Lab Results:  Results for orders placed or performed  during the hospital encounter of 07/24/17 (from the past 48 hour(s))  Hemoglobin A1c     Status: None   Collection Time: 07/24/17  6:21 AM  Result Value Ref Range   Hgb A1c MFr Bld 5.2 4.8 - 5.6 %    Comment: (NOTE) Pre diabetes:          5.7%-6.4% Diabetes:              >6.4% Glycemic control for   <7.0% adults with diabetes    Mean Plasma Glucose 102.54 mg/dL    Comment: Performed at [facility], [place] [address] [place], [place] [address]  Lipid panel     Status: Abnormal   Collection Time: 07/24/17  6:21 AM  Result Value Ref Range   Cholesterol 149 0 - 200 mg/dL   Triglycerides 218 (H) <150 mg/dL   HDL 41 >40 mg/dL   Total CHOL/HDL Ratio 3.6 RATIO   VLDL 44 (H) 0 - 40 mg/dL   LDL Cholesterol 64 0 - 99 mg/dL    Comment:        Total Cholesterol/HDL:CHD Risk Coronary Heart Disease Risk Table                     Men   Women  1/2 Average Risk   3.4   3.3  Average Risk       5.0   4.4  2 X Average Risk   9.6   7.1  3 X Average Risk  23.4   11.0        Use the calculated Patient Ratio above and the CHD Risk Table to determine the patient's CHD Risk.        ATP III CLASSIFICATION (LDL):  <100     mg/dL   Optimal  100-129  mg/dL   Near or Above                    Optimal  130-159  mg/dL   Borderline  160-189  mg/dL   High  >190     mg/dL   Very High Performed at [facility] [address] [place], [place] [address]   TSH     Status: None   Collection Time: 07/24/17  6:21 AM  Result Value Ref Range   TSH 1.936 0.350 - 4.500 uIU/mL    Comment: Performed by a 3rd Generation assay with a functional sensitivity of <=0.01 uIU/mL. Performed at [facility], [place] [address], [place], [place] [address]     Blood Alcohol level:  Lab Results  Component Value Date  ETH <10 17/00/1749    Metabolic Disorder Labs: Lab Results  Component Value Date   HGBA1C 5.2 07/24/2017   MPG 102.54 07/24/2017   No results found for: PROLACTIN Lab  Results  Component Value Date   CHOL 149 07/24/2017   TRIG 218 (H) 07/24/2017   HDL 41 07/24/2017   CHOLHDL 3.6 07/24/2017   VLDL 44 (H) 07/24/2017   LDLCALC 64 07/24/2017    Physical Findings: AIMS: Facial and Oral Movements Muscles of Facial Expression: None, normal Lips and Perioral Area: None, normal Jaw: None, normal Tongue: None, normal,Extremity Movements Upper (arms, wrists, hands, fingers): None, normal Lower (legs, knees, ankles, toes): None, normal, Trunk Movements Neck, shoulders, hips: None, normal, Overall Severity Severity of abnormal movements (highest score from questions above): None, normal Incapacitation due to abnormal movements: None, normal Patient's awareness of abnormal movements (rate only patient's report): No Awareness, Dental Status Current problems with teeth and/or dentures?: No Does patient usually wear dentures?: No  CIWA:  CIWA-Ar Total: 1 COWS:  COWS Total Score: 1  Musculoskeletal: Strength & Muscle Tone: within normal limits Gait & Station: normal Patient leans: N/A  Psychiatric Specialty Exam: Physical Exam  ROS no headache, no chest pain, no shortness of breath, no vomiting  Blood pressure 125/80, pulse 67, temperature 97.6 F (36.4 C), temperature source Oral, resp. rate 20, height '5\' 8"'$  (1.727 m), weight 85.3 kg (188 lb).Body mass index is 28.59 kg/m.  General Appearance: Fairly Groomed  Eye Contact:  Good  Speech:  Normal Rate  Volume:  Normal  Mood:  depressed, but reports feeling better than prior to admission  Affect:  mildly constricted, but reactive   Thought Process:  Linear and Descriptions of Associations: Intact  Orientation:  Full (Time, Place, and Person)  Thought Content:  no hallucinations, no delusions, not internally preoccupied   Suicidal Thoughts:  No denies any current suicidal or self injurious ideations, denies any homicidal or violent ideations  Homicidal Thoughts:  No  Memory:  recent and remote grossly  intact   Judgement:  Other:  fair - improving   Insight:  improving   Psychomotor Activity:  Normal  Concentration:  Concentration: Good and Attention Span: Good  Recall:  Good  Fund of Knowledge:  Good  Language:  Good  Akathisia:  Negative  Handed:  Right  AIMS (if indicated):     Assets:  Communication Skills Desire for Improvement  ADL's:  Intact  Cognition:  WNL  Sleep:  Number of Hours: 6.5    Assessment - patient is reporting feeling better than prior to admission and currently denies suicidal ideations. Attributes depression and anxiety to psychosocial stressors. At this time tolerating Zoloft ( which was currently increased to 150 mgrs QDAY) and Remeron ( which is new trial for patient) well.   Treatment Plan Summary: Daily contact with patient to assess and evaluate symptoms and progress in treatment, Medication management, Plan inpatient treatment  and medications as below Encourage group and milieu participation to work on coping skills and symptom reduction Continue Zoloft 150 mgrs QDAY for depression and anxiety Continue Remeron 7.5 mgrs QHS for insomnia, depression Continue Vistaril 25 mgrs Q 8 hours PRN for anxiety as needed  Treatment team working on disposition planning options Jenne Campus, MD 07/25/2017, 12:53 PM

## 2017-07-25 NOTE — [facility] Suicide Risk Assessment (Signed)
[facility] INPATIENT:  Family/Significant Other Suicide Prevention Education  Suicide Prevention Education:  Contact Attempts: Norman HerrlichSonya Hayden (mother [phone]), has been identified by the patient as the family member/significant other with whom the patient will be residing, and identified as the person(s) who will aid the patient in the event of a mental health crisis.  With written consent from the patient, two attempts were made to provide suicide prevention education, prior to and/or following the patient's discharge.  We were unsuccessful in providing suicide prevention education.  A suicide education pamphlet was given to the patient to share with family/significant other.  Date and time of first attempt: 07/25/17 at 11:25am. No answer, CSW left a message.  Jonathon JordanLynn B Chrystal Zeimet, [name], LCSWA  07/25/2017, 11:25 AM

## 2017-07-25 NOTE — Plan of Care (Signed)
Problem: Activity: Goal: Interest or engagement in activities will improve Outcome: Progressing Pt attends groups tonight

## 2017-07-25 NOTE — Plan of Care (Signed)
Problem: Education: Goal: Utilization of techniques to improve thought processes will improve Outcome: Progressing Nurse discussed depression/anxiety/coping skills with patient.    

## 2017-07-25 NOTE — [facility] Group Notes (Signed)
[name] Group Therapy Note  07/25/2017 1:15pm  Type of Therapy/Topic:  Group Therapy:  Feelings about Diagnosis  Participation Level: Active  Description of Group:   This group will allow patients to explore their thoughts and feelings about diagnoses they have received. Patients will be guided to explore their level of understanding and acceptance of these diagnoses. Facilitator will encourage patients to process their thoughts and feelings about the reactions of others to their diagnosis and will guide patients in identifying ways to discuss their diagnosis with significant others in their lives. This group will be process-oriented, with patients participating in exploration of their own experiences, giving and receiving support, and processing challenge from other group members.   Therapeutic Goals: 1. Patient will demonstrate understanding of diagnosis as evidenced by identifying two or more symptoms of the disorder 2. Patient will be able to express two feelings regarding the diagnosis 3. Patient will demonstrate their ability to communicate their needs through discussion and/or role play  Summary of Patient Progress: Pt was present for the duration of the group. Pt states that he has been struggling with a mental health condition for some time now and that he does not feel supported by his family because they don't understand. Pt explained that his family relies heavily on their faith and does not believe in seeking the help of a mental health professional. Pt also explained that his family often doesn't talk about their emotions which makes it difficult for him to talk to them about how he's feeling. Pt's affect was appropriate and pt's mood continues to improve.   Therapeutic Modalities:   Cognitive Behavioral Therapy Brief Therapy Feelings Identification    Dakota JordanLynn B Lynel Forester, [name], LCSWA 07/25/2017 2:40 PM

## 2017-07-25 NOTE — [facility] Suicide Risk Assessment (Signed)
[facility] INPATIENT:  Family/Significant Other Suicide Prevention Education  Suicide Prevention Education:  Education Completed; Norman HerrlichSonya Hayden (mother [phone]), has been identified by the patient as the family member/significant other with whom the patient will be residing, and identified as the person(s) who will aid the patient in the event of a mental health crisis (suicidal ideations/suicide attempt).  With written consent from the patient, the family member/significant other has been provided the following suicide prevention education, prior to the and/or following the discharge of the patient.  The suicide prevention education provided includes the following:  Suicide risk factors  Suicide prevention and interventions  National Suicide Hotline telephone number  [facility] assessment telephone number  [facility] Emergency Assistance 911  [facility] and/or Residential Mobile Crisis Unit telephone number  Request made of family/significant other to:  Remove weapons (e.g., guns, rifles, knives), all items previously/currently identified as safety concern.    Remove drugs/medications (over-the-counter, prescriptions, illicit drugs), all items previously/currently identified as a safety concern.  The family member/significant other verbalizes understanding of the suicide prevention education information provided.  The family member/significant other agrees to remove the items of safety concern listed above.  Pt's mother states that pt seems to be doing better and states that pt seems to be in "better spirits" than before. Pt's mother states that pt has been concerned about letting the family down. Pt's mother states that she has reassured the pt that they will always be proud of him and the hard work that he has put in to get to where he is in life. Pt's mother also confirms that pt does not have access to guns or weapons at home.  Jonathon JordanLynn B Jerrell Mangel, [name], LCSWA   07/25/2017, 11:29 AM

## 2017-07-25 NOTE — Progress Notes (Signed)
Adult Psychoeducational Group Note  Date:  07/25/2017 Time:  8:46 PM  Group Topic/Focus:  Wrap-Up Group:   The focus of this group is to help patients review their daily goal of treatment and discuss progress on daily workbooks.  Participation Level:  Active  Participation Quality:  Appropriate  Affect:  Appropriate  Cognitive:  Alert  Insight: Appropriate  Engagement in Group:  Engaged  Modes of Intervention:  Discussion  Additional Comments:  Pt rated his day 7/10. He stated that he woke up feeling better today. His goal is to focus more on himself instead of giving all he has to other people.   Kaleen OdeaCOOKE, Ron Beske R 07/25/2017, 8:46 PM

## 2017-07-25 NOTE — Progress Notes (Signed)
Recreation Therapy Notes  Animal-Assisted Activity (AAA) Program Checklist/Progress Notes Patient Eligibility Criteria Checklist & Daily Group note for Rec TxIntervention  Date: 10.23.2018 Time: 2:45pm Location: Adult Unit  AAA/T Program Assumption of Risk Form signed by Patient/ or Parent Legal Guardian Yes  Patient is free of allergies or sever asthma Yes  Patient reports no fear of animals Yes  Patient reports no history of cruelty to animals Yes  Patient understands his/her participation is voluntary Yes  Patient washes hands before animal contact Yes  Patient washes hands after animal contact Yes  Behavioral Response: Appropriate   Education:Hand Washing, Appropriate Animal Interaction   Education Outcome: Acknowledges education.   Clinical Observations/Feedback: Patient attended session and interacted appropriately with therapy dog and peers.   Terecia Plaut L Jasia Hiltunen, LRT/CTRS        Lonita Debes L 07/25/2017 3:50 PM 

## 2017-07-26 DIAGNOSIS — Z599 Problem related to housing and economic circumstances, unspecified: Secondary | ICD-10-CM

## 2017-07-26 DIAGNOSIS — F411 Generalized anxiety disorder: Secondary | ICD-10-CM

## 2017-07-26 DIAGNOSIS — F332 Major depressive disorder, recurrent severe without psychotic features: Principal | ICD-10-CM

## 2017-07-26 DIAGNOSIS — F1210 Cannabis abuse, uncomplicated: Secondary | ICD-10-CM

## 2017-07-26 DIAGNOSIS — F39 Unspecified mood [affective] disorder: Secondary | ICD-10-CM

## 2017-07-26 MED ORDER — TRAZODONE HCL 50 MG PO TABS
50.0000 mg | ORAL_TABLET | Freq: Every evening | ORAL | Status: DC | PRN
  Administered 2017-07-26: 50 mg via ORAL
  Filled 2017-07-26 (×6): qty 1

## 2017-07-26 NOTE — Progress Notes (Signed)
Pt attend wrap up group. His day was a 9. His goal was to get over his anxiety discharge. He's comfortable with people he has met here.

## 2017-07-26 NOTE — Progress Notes (Signed)
D: Patient states his day is "going well."  He is observed laughing and joking with his peers in the day room.  Patient rates his depression as a 5; hopelessness as a 4; anxiety as a 6.  Patient is a possible discharge for tomorrow per treatment team meeting.  He denies any thoughts of self harm.  He is attending group and participating in his treatment.  Patient is pleasant and interacting well with his peers. A: Continue to monitor medication management and MD orders.  Safety checks continued every 15 minutes per protocol.   R: Patient is receptive to staff; his behavior is appropriate.

## 2017-07-26 NOTE — Progress Notes (Signed)
[facility]  07/26/2017 2:21 PM Dakota SheehanBrandon Mcgee  MRN:  [id number]   Subjective:  Patient reports that he is doing really good today. He denies any SI/HI/AVH or depression or anxiety since he has gotten acquainted to the unit more. He is making plans to move in with a friend to help with bills and get rid of his apartment and help improve his financial status.   Objective: Patient's chart and findings reviewed and discussed with treatment team. Patient is pleasant and cooperative. He has been attending group and participating. He has been interacting with peers and staff appropriately. Patient has shown significant improvement and will possibly be discharged tomorrow.  Principal Problem: Severe recurrent major depression without psychotic features (HCC) Diagnosis:   Patient Active Problem List   Diagnosis Date Noted  . Severe recurrent major depression without psychotic features (HCC) [F33.2] 07/24/2017  . GAD (generalized anxiety disorder) [F41.1] 07/24/2017   Total Time spent with patient: 15 minutes  Past Psychiatric History: See H&P  Past Medical History:  Past Medical History:  Diagnosis Date  . Anxiety   . Bronchitis   . Depression    History reviewed. No pertinent surgical history. Family History: History reviewed. No pertinent family history. Family Psychiatric  History: See H&P Social History:  History  Alcohol Use  . 0.6 oz/week  . 1 Shots of liquor per week    Comment: 1 drink a month     History  Drug Use  . Frequency: 1.0 time per week  . Types: Marijuana    Comment: shares a blunt 2 to 3 times a month    Social History   Social History  . Marital status: Single    Spouse name: N/A  . Number of children: N/A  . Years of education: N/A   Social History Main Topics  . Smoking status: Never Smoker  . Smokeless tobacco: Never Used  . Alcohol use 0.6 oz/week    1 Shots of liquor per week     Comment: 1 drink a month  . Drug use: Yes     Frequency: 1.0 time per week    Types: Marijuana     Comment: shares a blunt 2 to 3 times a month  . Sexual activity: Yes    Birth control/ protection: None   Other Topics Concern  . None   Social History Narrative  . None   Additional Social History:    Pain Medications: NA Prescriptions: never abused Over the Counter: NA Longest period of sobriety (when/how long): per pt. only drinks 1 beer a month Name of Substance 1: Marijuana  1 - Age of First Use: 24 1 - Amount (size/oz): pt reports he shares a blunt 1 - Frequency: 2 to 3 times a month 1 - Duration: ongoing 1 - Last Use / Amount: 07/21/2017 Name of Substance 2: Alcohol 2 - Age of First Use: 20 2 - Amount (size/oz): less than 1 shot 2 - Frequency: once a month 2 - Duration: ongoing 2 - Last Use / Amount: 3 weeks ago                Sleep: Good  Appetite:  Good  Current Medications: Current Facility-Administered Medications  Medication Dose Route Frequency Provider Last Rate Last Dose  . acetaminophen (TYLENOL) tablet 650 mg  650 mg Oral Q6H PRN Nira ConnBerry, Jason A, NP      . albuterol (PROVENTIL HFA;VENTOLIN HFA) 108 (90 Base) MCG/ACT inhaler 1-2 puff  1-2 puff  Inhalation Q6H PRN Jackelyn Poling, NP      . alum & mag hydroxide-simeth (MAALOX/MYLANTA) 200-200-20 MG/5ML suspension 30 mL  30 mL Oral Q4H PRN Nira Conn A, NP      . hydrOXYzine (ATARAX/VISTARIL) tablet 25 mg  25 mg Oral TID PRN Nira Conn A, NP   25 mg at 07/25/17 0811  . magnesium hydroxide (MILK OF MAGNESIA) suspension 30 mL  30 mL Oral Daily PRN Nira Conn A, NP      . mirtazapine (REMERON) tablet 7.5 mg  7.5 mg Oral QHS Cobos, Fernando A, MD   7.5 mg at 07/25/17 2111  . sertraline (ZOLOFT) tablet 150 mg  150 mg Oral Daily Cobos, Rockey Situ, MD   150 mg at 07/26/17 1610    Lab Results: No results found for this or any previous visit (from the past 48 hour(s)).  Blood Alcohol level:  Lab Results  Component Value Date   ETH <10 07/23/2017     Metabolic Disorder Labs: Lab Results  Component Value Date   HGBA1C 5.2 07/24/2017   MPG 102.54 07/24/2017   No results found for: PROLACTIN Lab Results  Component Value Date   CHOL 149 07/24/2017   TRIG 218 (H) 07/24/2017   HDL 41 07/24/2017   CHOLHDL 3.6 07/24/2017   VLDL 44 (H) 07/24/2017   LDLCALC 64 07/24/2017    Physical Findings: AIMS: Facial and Oral Movements Muscles of Facial Expression: None, normal Lips and Perioral Area: None, normal Jaw: None, normal Tongue: None, normal,Extremity Movements Upper (arms, wrists, hands, fingers): None, normal Lower (legs, knees, ankles, toes): None, normal, Trunk Movements Neck, shoulders, hips: None, normal, Overall Severity Severity of abnormal movements (highest score from questions above): None, normal Incapacitation due to abnormal movements: None, normal Patient's awareness of abnormal movements (rate only patient's report): No Awareness, Dental Status Current problems with teeth and/or dentures?: No Does patient usually wear dentures?: No  CIWA:  CIWA-Ar Total: 1 COWS:  COWS Total Score: 1  Musculoskeletal: Strength & Muscle Tone: within normal limits Gait & Station: normal Patient leans: N/A  Psychiatric Specialty Exam: Physical Exam  Nursing note and vitals reviewed. Constitutional: He is oriented to person, place, and time. He appears well-developed and well-nourished.  Cardiovascular: Normal rate.   Respiratory: Effort normal.  Musculoskeletal: Normal range of motion.  Neurological: He is alert and oriented to person, place, and time.  Skin: Skin is warm.    Review of Systems  Constitutional: Negative.   HENT: Negative.   Eyes: Negative.   Respiratory: Negative.   Cardiovascular: Negative.   Gastrointestinal: Negative.   Genitourinary: Negative.   Musculoskeletal: Negative.   Skin: Negative.   Neurological: Negative.   Endo/Heme/Allergies: Negative.   Psychiatric/Behavioral: Negative.      Blood pressure 122/86, pulse 75, temperature 97.8 F (36.6 C), resp. rate 20, height 5\' 8"  (1.727 m), weight 85.3 kg (188 lb).Body mass index is 28.59 kg/m.  General Appearance: Casual  Eye Contact:  Good  Speech:  Clear and Coherent and Normal Rate  Volume:  Normal  Mood:  Euthymic  Affect:  Appropriate  Thought Process:  Coherent and Descriptions of Associations: Intact  Orientation:  Full (Time, Place, and Person)  Thought Content:  WDL  Suicidal Thoughts:  No  Homicidal Thoughts:  No  Memory:  Immediate;   Good Recent;   Good Remote;   Good  Judgement:  Good  Insight:  Good  Psychomotor Activity:  Normal  Concentration:  Concentration: Good and Attention  Span: Good  Recall:  Good  Fund of Knowledge:  Good  Language:  Good  Akathisia:  No  Handed:  Right  AIMS (if indicated):     Assets:  Communication Skills Desire for Improvement Financial Resources/Insurance Housing Physical Health Social Support Transportation Vocational/Educational  ADL's:  Intact  Cognition:  WNL  Sleep:  Number of Hours: 6.25   Problems Addressed: GAD MDD Severe  Treatment Plan Summary: Daily contact with patient to assess and evaluate symptoms and progress in treatment, Medication management and Plan is to:  -Continue Sertraline 150 mg PO Daily for mood stability -Continue Mirtazapine 7.5 mg PO QHS for mood stability and sleep -Continue Vistaril 25 mg PO TID PRN for anxiety -Encourage group therapy participation -Possible discharge tomorrow   Maryfrances Bunnell, FNP 07/26/2017, 2:21 PM   Agree with NP Progress Note

## 2017-07-26 NOTE — Tx Team (Signed)
Interdisciplinary Treatment and Diagnostic Plan Update 07/26/2017 Time of Session: 9:30am  Quincy SheehanBrandon Hopwood  MRN: [id number]  Principal Diagnosis: Severe recurrent major depression without psychotic features [facility](HCC)  Secondary Diagnoses: Principal Problem:   Severe recurrent major depression without psychotic features (HCC) Active Problems:   GAD (generalized anxiety disorder)   Current Medications:  Current Facility-Administered Medications  Medication Dose Route Frequency Provider Last Rate Last Dose  . acetaminophen (TYLENOL) tablet 650 mg  650 mg Oral Q6H PRN Nira ConnBerry, Jason A, NP      . albuterol (PROVENTIL HFA;VENTOLIN HFA) 108 (90 Base) MCG/ACT inhaler 1-2 puff  1-2 puff Inhalation Q6H PRN Nira ConnBerry, Jason A, NP      . alum & mag hydroxide-simeth (MAALOX/MYLANTA) 200-200-20 MG/5ML suspension 30 mL  30 mL Oral Q4H PRN Nira ConnBerry, Jason A, NP      . hydrOXYzine (ATARAX/VISTARIL) tablet 25 mg  25 mg Oral TID PRN Nira ConnBerry, Jason A, NP   25 mg at 07/25/17 0811  . magnesium hydroxide (MILK OF MAGNESIA) suspension 30 mL  30 mL Oral Daily PRN Nira ConnBerry, Jason A, NP      . mirtazapine (REMERON) tablet 7.5 mg  7.5 mg Oral QHS Cobos, Fernando A, MD   7.5 mg at 07/25/17 2111  . sertraline (ZOLOFT) tablet 150 mg  150 mg Oral Daily Cobos, Rockey SituFernando A, MD   150 mg at 07/26/17 [id number]    PTA Medications: Prescriptions Prior to Admission  Medication Sig Dispense Refill Last Dose  . albuterol (PROVENTIL HFA;VENTOLIN HFA) 108 (90 BASE) MCG/ACT inhaler Inhale 1-2 puffs into the lungs every 6 (six) hours as needed for wheezing or shortness of breath. 1 Inhaler 0 07/22/2017 at Unknown time  . sertraline (ZOLOFT) 100 MG tablet Take 100 mg by mouth daily.  5 07/23/2017 at Unknown time    Treatment Modalities: Medication Management, Group therapy, Case management,  1 to 1 session with clinician, Psychoeducation, Recreational therapy.  Patient Stressors: Financial difficulties Patient Strengths: Ability for  insight Active sense of humor Average or above average intelligence Capable of independent living Communication skills Supportive family/friends Work [employer]  Physician Treatment Plan for Primary Diagnosis: Severe recurrent major depression without psychotic features (HCC) Long Term Goal(s): Improvement in symptoms so as ready for discharge Short Term Goals: Ability to verbalize feelings will improve Ability to disclose and discuss suicidal ideas Ability to identify and develop effective coping behaviors will improve Ability to identify triggers associated with substance abuse/mental health issues will improve  Medication Management: Evaluate patient's response, side effects, and tolerance of medication regimen.  Therapeutic Interventions: 1 to 1 sessions, Unit Group sessions and Medication administration.  Evaluation of Outcomes: Progressing  Physician Treatment Plan for Secondary Diagnosis: Principal Problem:   Severe recurrent major depression without psychotic features (HCC) Active Problems:   GAD (generalized anxiety disorder)  Long Term Goal(s): Improvement in symptoms so as ready for discharge  Short Term Goals: Ability to verbalize feelings will improve Ability to disclose and discuss suicidal ideas Ability to identify and develop effective coping behaviors will improve Ability to identify triggers associated with substance abuse/mental health issues will improve  Medication Management: Evaluate patient's response, side effects, and tolerance of medication regimen.  Therapeutic Interventions: 1 to 1 sessions, Unit Group sessions and Medication administration.  Evaluation of Outcomes: Progressing  RN Treatment Plan for Primary Diagnosis: Severe recurrent major depression without psychotic features (HCC) Long Term Goal(s): Knowledge of disease and therapeutic regimen to maintain health will improve  Short Term Goals: Ability to identify and  develop effective coping  behaviors will improve and Compliance with prescribed medications will improve  Medication Management: RN will administer medications as ordered by provider, will assess and evaluate patient's response and provide education to patient for prescribed medication. RN will report any adverse and/or side effects to prescribing provider.  Therapeutic Interventions: 1 on 1 counseling sessions, Psychoeducation, Medication administration, Evaluate responses to treatment, Monitor vital signs and CBGs as ordered, Perform/monitor CIWA, COWS, AIMS and Fall Risk screenings as ordered, Perform wound care treatments as ordered.  Evaluation of Outcomes: Progressing  [name] Treatment Plan for Primary Diagnosis: Severe recurrent major depression without psychotic features (HCC) Long Term Goal(s): Safe transition to appropriate next level of care at discharge, Engage patient in therapeutic group addressing interpersonal concerns. Short Term Goals: Engage patient in aftercare planning with referrals and resources, Increase ability to appropriately verbalize feelings, Increase emotional regulation, Identify triggers associated with mental health/substance abuse issues and Increase skills for wellness and recovery  Therapeutic Interventions: Assess for all discharge needs, 1 to 1 time with Social worker, Explore available resources and support systems, Assess for adequacy in community support network, Educate family and significant other(s) on suicide prevention, Complete Psychosocial Assessment, Interpersonal group therapy.  Evaluation of Outcomes: Progressing  Progress in Treatment: Attending groups: Yes  Participating in groups: Yes Taking medication as prescribed: Yes, MD continues to assess for medication changes as needed Toleration medication: Yes, no side effects reported at this time Family/Significant other contact made: Yes, pt's mother contacted. Patient understands diagnosis: Yes, AEB pt's willingness to  participate in treatment. Discussing patient identified problems/goals with staff: Yes Medical problems stabilized or resolved: Yes Denies suicidal/homicidal ideation: Yes Issues/concerns per patient self-inventory: None Other: N/A  New problem(s) identified: None identified at this time.   New Short Term/Long Term Goal(s): None identified at this time.   Discharge Plan or Barriers: Pt will return home and follow up with an outpatient provider.  Reason for Continuation of Hospitalization:  Anxiety  Depression Medication stabilization  Estimated Length of Stay: 1-2 days; Pt will likely discharge tomorrow 07/27/17  Attendees: Patient: 07/26/2017 10:44 AM  Physician: Dr. Jama Flavors 07/26/2017 10:44 AM  Nursing: Rayfield Citizen RN; Jan, RN 07/26/2017 10:44 AM  RN Care Manager: Onnie Boer, RN 07/26/2017 10:44 AM  Social Worker: Donnelly Stager, LCSWA 07/26/2017 10:44 AM  Recreational Therapist:  07/26/2017 10:44 AM  Other: Armandina Stammer, NP 07/26/2017 10:44 AM  Other:  07/26/2017 10:44 AM  Other: 07/26/2017 10:44 AM   Scribe for Treatment Team: Jonathon [name], [name],LCSWA 07/26/2017 10:44 AM

## 2017-07-26 NOTE — Progress Notes (Signed)
D- Patient alert and oriented.  Patient appears depressed and anxious.  Brightens on approach.  Patient currently denies SI, HI, AVH, and pain.  Patient reports that he is feeling "better than yesterday".  Patient reports, prior to admission, he felt embarrassed stating "I was embarrassed that I couldn't handle the stress and then embarrassed that I wasn't strong enough to go through with a suicide plan".  Patient states "I feel like I am failing myself".  Patient is observed interacting well with peers.  Patient attended and participated in wrap up group.  Patient took medications without issue.  No complaints.    A- Support and encouragement provided.  Routine safety checks conducted every 15 minutes.  Patient informed to notify staff with problems or concerns. R- No adverse drug reactions noted. Patient contracts for safety at this time. Patient compliant with medications and treatment plan. Patient receptive, calm, and cooperative. Patient remains safe at this time.

## 2017-07-26 NOTE — Progress Notes (Signed)
Nursing Progress Note: 7p-7a D: Pt currently presents with a pleasant affect and behavior. Pt states "I have been having these very vivid dreams... Not bad ones just vivid, and they wake me up." Interacting appropriately with the milieu. Pt reports off and on sleep during the previous night with past medication regimen. Pt did attend wrap-up group.  A: Pt provided with medications per providers orders. Pt's labs and vitals were monitored throughout the night. Pt supported emotionally and encouraged to express concerns and questions. Pt educated on medications.  R: Pt's safety ensured with 15 minute and environmental checks. Pt currently denies SI, HI, and AVH. Pt verbally contracts to seek staff if SI,HI, or AVH occurs and to consult with staff before acting on any harmful thoughts. Will continue to monitor.

## 2017-07-26 NOTE — Progress Notes (Signed)
Recreation Therapy Notes  Date: 07/26/17 Time: 0930 Location: 300 Hall Dayroom  Group Topic: Stress Management  Goal Area(s) Addresses:  Patient will verbalize importance of using healthy stress management.  Patient will identify positive emotions associated with healthy stress management.   Behavioral Response: Engaged  Intervention: Stress Management  Activity :  Guided Imagery.  LRT introduced the stress management technique of guided imagery.  Patients were to follow along as LRT read script to engage in the activity.  Education:  Stress Management, Discharge Planning.   Education Outcome: Acknowledges edcuation/In group clarification offered/Needs additional education  Clinical Observations/Feedback:  Pt attended group.    Nalaysia Manganiello, LRT/CTRS         Kayli Beal A 07/26/2017 11:38 AM 

## 2017-07-27 MED ORDER — TRAZODONE HCL 50 MG PO TABS: 50.0000 mg | ORAL_TABLET | Freq: Every evening | ORAL | 0 refills | Status: AC | PRN

## 2017-07-27 MED ORDER — SERTRALINE HCL 50 MG PO TABS: 150.0000 mg | ORAL_TABLET | Freq: Every day | ORAL | 0 refills | Status: DC

## 2017-07-27 MED ORDER — HYDROXYZINE HCL 25 MG PO TABS: 25.0000 mg | ORAL_TABLET | Freq: Three times a day (TID) | ORAL | 0 refills | Status: AC | PRN

## 2017-07-27 MED ORDER — ALBUTEROL SULFATE HFA 108 (90 BASE) MCG/ACT IN AERS: 1.0000 | INHALATION_SPRAY | Freq: Four times a day (QID) | RESPIRATORY_TRACT | 0 refills | Status: AC | PRN

## 2017-07-27 MED ORDER — SERTRALINE HCL 50 MG PO TABS: 150.0000 mg | ORAL_TABLET | Freq: Every day | ORAL | 0 refills | Status: AC

## 2017-07-27 NOTE — Discharge Summary (Signed)
Physician Discharge Summary Note  Patient:  Dakota Mcgee is an 26 y.o., male MRN:  [id number] DOB:  10/16/1990 Patient phone:  [phone] (home)  Patient address:   [address] [place] [address],  Total Time spent with patient: 20 minutes  Date of Admission:  07/24/2017 Date of Discharge: 07/27/17   Reason for Admission:  Worsening depression with SI  Principal Problem: Severe recurrent major depression without psychotic features [facility]) Discharge Diagnoses: Patient Active Problem List   Diagnosis Date Noted  . Severe recurrent major depression without psychotic features (HCC) [F33.2] 07/24/2017  . GAD (generalized anxiety disorder) [F41.1] 07/24/2017    Past Psychiatric History: Undiagnosed depression and anxiety since college  Past Medical History:  Past Medical History:  Diagnosis Date  . Anxiety   . Bronchitis   . Depression    History reviewed. No pertinent surgical history. Family History: History reviewed. No pertinent family history. Family Psychiatric  History: Sister - Bipolar Social History:  History  Alcohol Use  . 0.6 oz/week  . 1 Shots of liquor per week    Comment: 1 drink a month     History  Drug Use  . Frequency: 1.0 time per week  . Types: Marijuana    Comment: shares a blunt 2 to 3 times a month    Social History   Social History  . Marital status: Single    Spouse name: N/A  . Number of children: N/A  . Years of education: N/A   Social History Main Topics  . Smoking status: Never Smoker  . Smokeless tobacco: Never Used  . Alcohol use 0.6 oz/week    1 Shots of liquor per week     Comment: 1 drink a month  . Drug use: Yes    Frequency: 1.0 time per week    Types: Marijuana     Comment: shares a blunt 2 to 3 times a month  . Sexual activity: Yes    Birth control/ protection: None   Other Topics Concern  . None   Social History Narrative  . None    Hospital Course:   07/24/17 ED [facility] Assessment: 26  y.o.malewho presents to the ED voluntarily due to increased depression, anxiety, and suicidal thoughts. Pt reports he has been undergoing severe financial strains and experiences ongoing anxiety. Pt reports his anxiety is triggered by racing thoughts and he feels as if his throat is closing and he cannot breathe. Pt states he uses marijuana occassionally to assist with his anxiety. Pt reports today, PTA he contemplated suicide. Pt reports he has experienced SI in the past but states he is usually able to "rationalize with himself." Pt reports his SI was more severe today as he actually poured about 10 pills in his hand and thought about swallowing all of the pills. Pt states he has never gone this far with his SI, therefore he decided to call a friend for help who brought him to the hospital. Pt states he often feels restless. Pt states he feels he is unable to rest due to high anxiety and even when he sleeps, he wakes up feeling tired. Pt states he "just can't relax."    07/24/17 [facility] Provider Assessment: Today patient cofirms all information above. Single. No children. He reports that he has been dealing with depression since college. He is the first one in his family to go to college and he felt that he was the "black sheep" of the family already, so he isolated  himself more from his family. His mother is the most supportive. He is openly gay and his father did not accept it well and they still do not talk about it. He denies any sexual, physical, or emotional abuse. He reports that his anxiety is the bigger problem, but his depression has became severe recently. 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 He reports having financial stressors taht started about 2 years ago and his boyfriend left him with all the bills and now he is worried about getting evicted and if his electric bill is even $30 more than last month. He reports seeing his PCP for medication but  has only tried Zoloft, and even at 100 mg is not sufficient in maintaining his anxiety and depression. He is open to medication use. He denies any any SI/HI/AVH currently and contracts for safety.  [facility] and stabilized with medication and therapy. Patient was continued on Zoloft and titrated to 150 mg Daily. He also used Vistaril and Trazodone PRN for anxiety and sleep. Patient showed improvement with improved sleep, mood, appetite, affect, and interaction. He has been seen in the day room interacting with peers and staff. He has been attending group and participating. He has been visited by friends and there was a call made to mom and patient has support. Patient has plans to stay with friend to help improve financial standing. Patient is provided with prescriptions for medications upon discharge. Patient denies any SI/HI/AVH and depression.    Physical Findings: AIMS: Facial and Oral Movements Muscles of Facial Expression: None, normal Lips and Perioral Area: None, normal Jaw: None, normal Tongue: None, normal,Extremity Movements Upper (arms, wrists, hands, fingers): None, normal Lower (legs, knees, ankles, toes): None, normal, Trunk Movements Neck, shoulders, hips: None, normal, Overall Severity Severity of abnormal movements (highest score from questions above): None, normal Incapacitation due to abnormal movements: None, normal Patient's awareness of abnormal movements (rate only patient's report): No Awareness, Dental Status Current problems with teeth and/or dentures?: No Does patient usually wear dentures?: No  CIWA:  CIWA-Ar Total: 1 COWS:  COWS Total Score: 1  Musculoskeletal: Strength & Muscle Tone: within normal limits Gait & Station: normal Patient leans: N/A  Psychiatric Specialty Exam: Physical Exam  Nursing note and vitals reviewed. Constitutional: He is oriented to person, place, and time. He appears well-developed and  well-nourished.  Cardiovascular: Normal rate.   Respiratory: Effort normal.  Musculoskeletal: Normal range of motion.  Neurological: He is alert and oriented to person, place, and time.  Skin: Skin is warm.    Review of Systems  Constitutional: Negative.   HENT: Negative.   Eyes: Negative.   Respiratory: Negative.   Cardiovascular: Negative.   Gastrointestinal: Negative.   Genitourinary: Negative.   Musculoskeletal: Negative.   Skin: Negative.   Neurological: Negative.   Endo/Heme/Allergies: Negative.   Psychiatric/Behavioral: Negative.     Blood pressure 119/72, pulse 83, temperature 97.8 F (36.6 C), temperature source Oral, resp. rate 18, height 5\' 8"  (1.727 m), weight 85.3 kg (188 lb).Body mass index is 28.59 kg/m.  General Appearance: Casual  Eye Contact:  Good  Speech:  Clear and Coherent and Normal Rate  Volume:  Normal  Mood:  Anxious and Euthymic  Affect:  Appropriate  Thought Process:  Coherent and Descriptions of Associations: Intact  Orientation:  Full (Time, Place, and Person)  Thought Content:  WDL  Suicidal Thoughts:  No  Homicidal  Thoughts:  No  Memory:  Immediate;   Good Recent;   Good Remote;   Good  Judgement:  Good  Insight:  Good  Psychomotor Activity:  Normal  Concentration:  Concentration: Good and Attention Span: Good  Recall:  Good  Fund of Knowledge:  Good  Language:  Good  Akathisia:  No  Handed:  Right  AIMS (if indicated):     Assets:  Communication Skills Desire for Improvement Financial Resources/Insurance Housing Physical Health Social Support Transportation Vocational/Educational  ADL's:  Intact  Cognition:  WNL  Sleep:  Number of Hours: 6.75     Have you used any form of tobacco in the last 30 days? (Cigarettes, Smokeless Tobacco, Cigars, and/or Pipes): No  Has this patient used any form of tobacco in the last 30 days? (Cigarettes, Smokeless Tobacco, Cigars, and/or Pipes) Yes, No  Blood Alcohol level:  Lab Results   Component Value Date   ETH <10 07/23/2017    Metabolic Disorder Labs:  Lab Results  Component Value Date   HGBA1C 5.2 07/24/2017   MPG 102.54 07/24/2017   No results found for: PROLACTIN Lab Results  Component Value Date   CHOL 149 07/24/2017   TRIG 218 (H) 07/24/2017   HDL 41 07/24/2017   CHOLHDL 3.6 07/24/2017   VLDL 44 (H) 07/24/2017   LDLCALC 64 07/24/2017    See Psychiatric Specialty Exam and Suicide Risk Assessment completed by Attending Physician prior to discharge.  Discharge destination:  Home  Is patient on multiple antipsychotic therapies at discharge:  No   Has Patient had three or more failed trials of antipsychotic monotherapy by history:  No  Recommended Plan for Multiple Antipsychotic Therapies: NA   Allergies as of 07/27/2017      Reactions   Lactose Intolerance (gi)       Medication List    TAKE these medications     Indication  albuterol 108 (90 Base) MCG/ACT inhaler Commonly known as:  PROVENTIL HFA;VENTOLIN HFA Inhale 1-2 puffs into the lungs every 6 (six) hours as needed for wheezing or shortness of breath.  Indication:  Asthma   hydrOXYzine 25 MG tablet Commonly known as:  ATARAX/VISTARIL Take 1 tablet (25 mg total) by mouth 3 (three) times daily as needed for anxiety.  Indication:  Feeling Anxious   sertraline 50 MG tablet Commonly known as:  ZOLOFT Take 3 tablets (150 mg total) by mouth daily. For mood control What changed:  medication strength  how much to take  additional instructions  Indication:  mood stability   traZODone 50 MG tablet Commonly known as:  DESYREL Take 1 tablet (50 mg total) by mouth at bedtime as needed for sleep.  Indication:  Trouble Sleeping      Follow-up Information    [facility] Follow up on 08/01/2017.   Why:  Therapy appointment 10/30 at 3pm with Corliss Blacker. Medication management appointment 12/13 at 9:30am with Jonette Mate. Please call the office at least 3 days before  your appointment to pay a 20 dollar appointment. If not the appt will be cancelled. Contact information: [address] [place] [address] [phone]        [facility] Follow up.   Why:  Social worker left a message with the receptionist. Awaiting call back for gap medication management appointment.  Contact information: [address], [place] [address] P: [phone] F: [phone]           Follow-up recommendations:  Continue activity as  tolerated. Continue diet as recommended by your PCP. Ensure to keep all appointments with outpatient providers.  Comments:  Patient is instructed prior to discharge to: Take all medications as prescribed by his/her mental healthcare provider. Report any adverse effects and or reactions from the medicines to his/her outpatient provider promptly. Patient has been instructed & cautioned: To not engage in alcohol and or illegal drug use while on prescription medicines. In the event of worsening symptoms, patient is instructed to call the crisis hotline, 911 and or go to the nearest ED for appropriate evaluation and treatment of symptoms. To follow-up with his/her primary care provider for your other medical issues, concerns and or health care needs.    Signed: Gerlene Burdock Money, FNP 07/27/2017, 8:38 AM   Patient seen, Suicide Assessment Completed.  Disposition Plan Reviewed

## 2017-07-27 NOTE — [facility] Suicide Risk Assessment (Signed)
[facility] Discharge Suicide Risk Assessment   Principal Problem: Severe recurrent major depression without psychotic features [facility]) Discharge Diagnoses:  Patient Active Problem List   Diagnosis Date Noted  . Severe recurrent major depression without psychotic features (HCC) [F33.2] 07/24/2017  . GAD (generalized anxiety disorder) [F41.1] 07/24/2017    Total Time spent with patient: 30 minutes  Musculoskeletal: Strength & Muscle Tone: within normal limits Gait & Station: normal Patient leans: N/A  Psychiatric Specialty Exam: ROS  No chest pain, no shortness of breath, no vomiting   Blood pressure 119/72, pulse 83, temperature 97.8 F (36.6 C), temperature source Oral, resp. rate 18, height 5\' 8"  (1.727 m), weight 85.3 kg (188 lb).Body mass index is 28.59 kg/m.  General Appearance: Well Groomed  Eye Contact::  Good  Speech:  Normal Rate409  Volume:  Normal  Mood:  improving mood, reports it is an 8/10 today  Affect:  Appropriate and Full Range  Thought Process:  Linear and Descriptions of Associations: Intact  Orientation:  Full (Time, Place, and Person)  Thought Content:  no hallucinations, no delusions,not internally preoccupied   Suicidal Thoughts:  No denies suicidal ideations, no self injurious ideations, no homicidal or violent ideations  Homicidal Thoughts:  No  Memory:  recent and remote grossly intact   Judgement:  Good  Insight:  Good  Psychomotor Activity:  Normal  Concentration:  Good  Recall:  Good  Fund of Knowledge:Good  Language: Good  Akathisia:  Negative  Handed:  Right  AIMS (if indicated):     Assets:  Communication Skills Desire for Improvement Resilience  Sleep:  Number of Hours: 6.75  Cognition: WNL  ADL's:  Intact   Mental Status Per Nursing Assessment::   On Admission:  NA  Demographic Factors:  26 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 , Chartered loss adjuster, 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  Loss Factors: Financial difficulties, distant from family   Historical  Factors: History of depression, no history of psychiatric admissions, no prior suicidal attempts   Risk Reduction Factors:   Sense of responsibility to family, Employed, Living with another person, especially a relative and Positive coping skills or problem solving skills  Continued Clinical Symptoms:  At this time patient is alert, attentive, well related, well groomed, mood improved, affect is appropriate and more reactive, no thought disorder, no suicidal or self injurious ideations, no homicidal or violent ideations, no psychotic symptoms. Future oriented.  Behavior on unit is calm and in good control, pleasant on approach Denies medication side effects , tolerating Zoloft well .  Cognitive Features That Contribute To Risk:  No gross cognitive deficits noted upon discharge. Is alert , attentive, and oriented x 3   Suicide Risk:  Mild:  Suicidal ideation of limited frequency, intensity, duration, and specificity.  There are no identifiable plans, no associated intent, mild dysphoria and related symptoms, good self-control (both objective and subjective assessment), few other risk factors, and identifiable protective factors, including available and accessible social support.  Follow-up Information    [facility] on 08/01/2017.   Why:  Therapy appointment 10/30 at 3pm with Corliss Blacker. Medication management appointment 12/13 at 9:30am with Jonette Mate. Please call the office at least 3 days before your appointment to pay a 20 dollar appointment. If not the appt will be cancelled. Contact information: [address] [place] [address] [phone]        [facility] Follow up.   Why:  Social worker left a message with the receptionist. Awaiting call back  for gap medication management appointment.  Contact information: [address]  [place], [place] [address] P: [phone] F: [phone]           Plan Of Care/Follow-up  recommendations:  Activity:  as tolerated  Diet:  regular Tests:  NA Other:  see below Patient is leaving unit in good spirits  Plans to return home Plans to follow up as above  Craige CottaFernando A Cobos, MD 07/27/2017, 10:17 AM

## 2017-07-27 NOTE — Progress Notes (Signed)
D:  Patient's self inventory sheet, patient has poor sleep, sleep medication not helpful.  Good appetite, normal energy level, good concentration.  Rated depression and hopeless 2, anxiety 5.  Denied withdrawals.  Denied SI.  Physical problems, lethargic  (rough sleep).  Physical pain, worst pain #3, back, joints.  No pain medications.  Goal is prepare for discharge and face problems outside these doors.  Plans to work on positive and proactive thinking.  No discharge plans. A:  Medications administered per MD orders.  Emotional support and encouragement given patient. R:  Denied SI and HI, contracts for safety.  Denied A/V hallucinations.  Safety maintained with 15 minutes checks.

## 2017-07-27 NOTE — [facility] Group Notes (Signed)
[facility] [employer] Group Therapy 07/26/2017 1:15pm  Type of Therapy: [employer] Presentation  Participation Level: Active  Participation Quality: Attentive  Affect: Appropriate  Cognitive: Oriented  Insight: Developing/Improving  Engagement in Therapy: Engaged  Modes of Intervention: Discussion, Education and Socialization  Summary of Progress/Problems: [employer] (MHA) Speaker came to talk about his personal journey with living with a mental health diagnosis. The pt processed ways by which to relate to the speaker. MHA speaker provided handouts and educational information pertaining to groups and services offered by the MHA. Pt was engaged in speaker's presentation and was receptive to resources provided.    Pattrick Bady B Henrry Feil, [name], LCSWA 07/27/2017 9:07 AM   

## 2017-07-27 NOTE — Progress Notes (Signed)
Discharge Note:  Patient discharged home with friend. Patient denied SI and HI.  Denied A/V hallucinations.  Suicide prevention information given and discussed with patient who stated he understood and had no questions.  Patient stated he received all his belongings, clothing, toiletries, misc items, shoes, phone, jacket, prescriptions, etc.  Patient stated [facility].  All required discharge information given to patient at discharge.

## 2017-07-27 NOTE — Progress Notes (Signed)
  [facility] Discharge Plan :  Will you be returning to the same living situation after discharge:  No. Pt will be going to live with a friend. At discharge, do you have transportation home?: Yes,  pt has access to transportation. Do you have the ability to pay for your medications: Yes,  pt has insurance.  Release of information consent forms completed and in the chart;  Patient's signature needed at discharge.  Patient to Follow up at: Follow-up Information    [facility] Follow up on 08/01/2017.   Why:  Therapy appointment 10/30 at 3pm with Corliss BlackerSarah Freeman. Medication management appointment 12/13 at 9:30am with Jonette MateKelly Newsome. Please call the office at least 3 days before your appointment to pay a 20 dollar deposit. If not the appt will be cancelled. Contact information: [address] [place] [address] [phone]        [facility] Follow [facility].   Why:  Social worker has left several messages with the receptionist but has been unable to schedule your gap appointment with Dr. Wylene Simmerisovec. Please call the office upon discharge to schedule a medication management appt until you can be seen by your new provider Contact information: [address]  [place], [place] [address] P: [phone] F: [phone]           Next level of care provider [facility] Link:no  Safety Planning and Suicide Prevention discussed: Yes,  with pt, and with pt's mother.  Have you used any form of tobacco in the last 30 days? (Cigarettes, Smokeless Tobacco, Cigars, and/or Pipes): No  Has patient been referred to the Quitline?: N/A patient is not a smoker  Patient has been referred for addiction treatment: N/A  Jonathon JordanLynn B Elisha Cooksey, [name], LCSWA 07/27/2017, 3:07 PM
